# Patient Record
Sex: Male | Born: 1950 | Race: White | Hispanic: No | Marital: Married | Smoking: Current every day smoker
Health system: Southern US, Community
[De-identification: ages and names within clinical notes are randomized; demographics above are authoritative.]

## PROBLEM LIST (undated history)

## (undated) DIAGNOSIS — F329 Major depressive disorder, single episode, unspecified: Secondary | ICD-10-CM

## (undated) DIAGNOSIS — K219 Gastro-esophageal reflux disease without esophagitis: Secondary | ICD-10-CM

## (undated) DIAGNOSIS — M1990 Unspecified osteoarthritis, unspecified site: Secondary | ICD-10-CM

## (undated) DIAGNOSIS — F32A Depression, unspecified: Secondary | ICD-10-CM

## (undated) HISTORY — PX: LUMBAR FUSION: SHX111

## (undated) HISTORY — PX: KNEE SURGERY: SHX244

## (undated) HISTORY — DX: Depression, unspecified: F32.A

## (undated) HISTORY — DX: Gastro-esophageal reflux disease without esophagitis: K21.9

## (undated) HISTORY — DX: Major depressive disorder, single episode, unspecified: F32.9

## (undated) HISTORY — DX: Unspecified osteoarthritis, unspecified site: M19.90

---

## 1981-08-25 HISTORY — PX: LUMBAR LAMINECTOMY: SHX95

## 1984-08-25 HISTORY — PX: ANTERIOR LUMBAR FUSION: SHX1170

## 1987-08-26 HISTORY — PX: INGUINAL HERNIA REPAIR: SUR1180

## 1998-04-18 ENCOUNTER — Emergency Department (HOSPITAL_COMMUNITY): Admission: EM | Admit: 1998-04-18 | Discharge: 1998-04-18 | Payer: Self-pay | Admitting: Emergency Medicine

## 1998-04-18 ENCOUNTER — Encounter: Admission: RE | Admit: 1998-04-18 | Discharge: 1998-04-18 | Payer: Self-pay | Admitting: *Deleted

## 1999-07-09 ENCOUNTER — Emergency Department (HOSPITAL_COMMUNITY): Admission: EM | Admit: 1999-07-09 | Discharge: 1999-07-09 | Payer: Self-pay | Admitting: *Deleted

## 2000-10-02 ENCOUNTER — Ambulatory Visit (HOSPITAL_COMMUNITY): Admission: RE | Admit: 2000-10-02 | Discharge: 2000-10-02 | Payer: Self-pay | Admitting: *Deleted

## 2001-12-17 ENCOUNTER — Encounter: Payer: Self-pay | Admitting: Family Medicine

## 2001-12-17 ENCOUNTER — Encounter: Admission: RE | Admit: 2001-12-17 | Discharge: 2001-12-17 | Payer: Self-pay | Admitting: Family Medicine

## 2003-08-26 HISTORY — PX: BACK SURGERY: SHX140

## 2003-10-10 ENCOUNTER — Emergency Department (HOSPITAL_COMMUNITY): Admission: EM | Admit: 2003-10-10 | Discharge: 2003-10-10 | Payer: Self-pay | Admitting: Emergency Medicine

## 2003-11-01 ENCOUNTER — Ambulatory Visit (HOSPITAL_COMMUNITY): Admission: RE | Admit: 2003-11-01 | Discharge: 2003-11-01 | Payer: Self-pay | Admitting: Orthopedic Surgery

## 2003-11-02 ENCOUNTER — Encounter: Admission: RE | Admit: 2003-11-02 | Discharge: 2003-12-07 | Payer: Self-pay | Admitting: Family Medicine

## 2004-06-25 ENCOUNTER — Emergency Department (HOSPITAL_COMMUNITY): Admission: EM | Admit: 2004-06-25 | Discharge: 2004-06-25 | Payer: Self-pay | Admitting: Emergency Medicine

## 2004-09-02 ENCOUNTER — Encounter: Admission: RE | Admit: 2004-09-02 | Discharge: 2004-09-02 | Payer: Self-pay | Admitting: Family Medicine

## 2004-09-19 ENCOUNTER — Ambulatory Visit (HOSPITAL_COMMUNITY): Admission: RE | Admit: 2004-09-19 | Discharge: 2004-09-19 | Payer: Self-pay | Admitting: *Deleted

## 2004-10-15 ENCOUNTER — Ambulatory Visit (HOSPITAL_COMMUNITY): Admission: RE | Admit: 2004-10-15 | Discharge: 2004-10-15 | Payer: Self-pay | Admitting: Neurosurgery

## 2004-11-08 ENCOUNTER — Ambulatory Visit (HOSPITAL_COMMUNITY): Admission: RE | Admit: 2004-11-08 | Discharge: 2004-11-08 | Payer: Self-pay | Admitting: Neurosurgery

## 2004-11-21 ENCOUNTER — Inpatient Hospital Stay (HOSPITAL_COMMUNITY): Admission: RE | Admit: 2004-11-21 | Discharge: 2004-11-24 | Payer: Self-pay | Admitting: Neurosurgery

## 2005-01-05 ENCOUNTER — Emergency Department: Payer: Self-pay | Admitting: Emergency Medicine

## 2005-05-17 ENCOUNTER — Encounter: Admission: RE | Admit: 2005-05-17 | Discharge: 2005-05-17 | Payer: Self-pay | Admitting: Neurosurgery

## 2010-09-14 ENCOUNTER — Encounter: Payer: Self-pay | Admitting: Family Medicine

## 2011-05-27 ENCOUNTER — Observation Stay: Payer: Self-pay | Admitting: Specialist

## 2011-05-27 ENCOUNTER — Ambulatory Visit: Payer: Self-pay | Admitting: Urology

## 2012-06-08 DIAGNOSIS — N529 Male erectile dysfunction, unspecified: Secondary | ICD-10-CM | POA: Insufficient documentation

## 2013-06-08 DIAGNOSIS — J982 Interstitial emphysema: Secondary | ICD-10-CM | POA: Insufficient documentation

## 2013-06-08 DIAGNOSIS — S42002A Fracture of unspecified part of left clavicle, initial encounter for closed fracture: Secondary | ICD-10-CM | POA: Insufficient documentation

## 2013-06-08 DIAGNOSIS — S2242XA Multiple fractures of ribs, left side, initial encounter for closed fracture: Secondary | ICD-10-CM | POA: Insufficient documentation

## 2013-06-16 DIAGNOSIS — M7989 Other specified soft tissue disorders: Secondary | ICD-10-CM | POA: Insufficient documentation

## 2013-06-20 ENCOUNTER — Encounter: Payer: Self-pay | Admitting: Surgery

## 2013-06-25 ENCOUNTER — Encounter: Payer: Self-pay | Admitting: Surgery

## 2013-07-08 ENCOUNTER — Encounter: Payer: Self-pay | Admitting: Adult Health

## 2013-07-08 ENCOUNTER — Ambulatory Visit (INDEPENDENT_AMBULATORY_CARE_PROVIDER_SITE_OTHER): Payer: Medicare Other | Admitting: Adult Health

## 2013-07-08 VITALS — BP 128/82 | HR 85 | Temp 98.4°F | Resp 14 | Ht 73.0 in | Wt 238.0 lb

## 2013-07-08 DIAGNOSIS — M549 Dorsalgia, unspecified: Secondary | ICD-10-CM

## 2013-07-08 DIAGNOSIS — K219 Gastro-esophageal reflux disease without esophagitis: Secondary | ICD-10-CM | POA: Insufficient documentation

## 2013-07-08 DIAGNOSIS — G8929 Other chronic pain: Secondary | ICD-10-CM | POA: Insufficient documentation
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
## 2013-07-08 NOTE — Progress Notes (Signed)
Pre visit review using our clinic review tool, if applicable. No additional management support is needed unless otherwise documented below in the visit note. 
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
## 2013-07-08 NOTE — Assessment & Plan Note (Signed)
Occasional symptoms. [employer] as needed. Continue to follow.

## 2013-07-08 NOTE — Patient Instructions (Signed)
   Thank you for choosing [place] [facility] for your health care needs.  I am referring you to the [facility] located in [place].  They will call you with an appointment.  I will request your medical records from Dr. Tenny Craw and recent hospitalization at [facility] to see what labs and tests were done.

## 2013-07-25 ENCOUNTER — Encounter: Payer: Self-pay | Admitting: Surgery

## 2013-08-28 ENCOUNTER — Encounter: Payer: Self-pay | Admitting: Adult Health

## 2013-09-06 ENCOUNTER — Telehealth: Payer: Self-pay | Admitting: Adult Health

## 2013-09-06 NOTE — Telephone Encounter (Signed)
The patient is needing a referral for his surgery at [facility] for a broken collar bone Dr. [place] at [place] will be performing the surgery.  The patient's surgery is schedule for 1.16.15. He wants to speak to Raquel.

## 2013-09-07 DIAGNOSIS — Z79891 Long term (current) use of opiate analgesic: Secondary | ICD-10-CM | POA: Insufficient documentation

## 2013-09-07 NOTE — Telephone Encounter (Signed)
Pt states he obtained referral from another office.

## 2013-09-07 NOTE — Telephone Encounter (Signed)
See the note below

## 2013-09-07 NOTE — Telephone Encounter (Signed)
We cannot accept referral for other office through the patient. The office has to contact us directly,and in the future we have to have seen the patient for the referral reason or I cannot process it.

## 2013-09-07 NOTE — Telephone Encounter (Signed)
Amber, Can you call the pt and let him know that his surgeon's office needs to call for referral. Sorry if you have gotten this message more than once. I do not know where the other one went. Raquel

## 2013-12-21 DIAGNOSIS — J449 Chronic obstructive pulmonary disease, unspecified: Secondary | ICD-10-CM | POA: Insufficient documentation

## 2014-01-19 DIAGNOSIS — F4010 Social phobia, unspecified: Secondary | ICD-10-CM | POA: Insufficient documentation

## 2014-02-08 DIAGNOSIS — M961 Postlaminectomy syndrome, not elsewhere classified: Secondary | ICD-10-CM | POA: Insufficient documentation

## 2015-05-14 DIAGNOSIS — N411 Chronic prostatitis: Secondary | ICD-10-CM | POA: Insufficient documentation

## 2015-05-14 DIAGNOSIS — R319 Hematuria, unspecified: Secondary | ICD-10-CM | POA: Insufficient documentation

## 2016-04-09 DIAGNOSIS — Z79899 Other long term (current) drug therapy: Secondary | ICD-10-CM | POA: Insufficient documentation

## 2017-01-28 DIAGNOSIS — Z0289 Encounter for other administrative examinations: Secondary | ICD-10-CM | POA: Insufficient documentation

## 2017-07-03 ENCOUNTER — Telehealth: Payer: Self-pay | Admitting: Urology

## 2017-07-03 NOTE — Telephone Encounter (Signed)
Can we get my last office note from [facility]?  There are no [facility] notes in Care Everywhere.

## 2017-07-03 NOTE — Telephone Encounter (Signed)
Former [employer] Patient called the office today requesting a refill of Sildenafil to be sent to the [facility].  Patient can be reached at [phone].

## 2017-07-07 ENCOUNTER — Encounter: Payer: Self-pay | Admitting: Urology

## 2017-07-07 ENCOUNTER — Telehealth: Payer: Self-pay | Admitting: Urology

## 2017-07-07 ENCOUNTER — Ambulatory Visit (INDEPENDENT_AMBULATORY_CARE_PROVIDER_SITE_OTHER): Payer: BLUE CROSS/BLUE SHIELD | Admitting: Urology

## 2017-07-07 VITALS — BP 149/74 | HR 99 | Ht 73.0 in | Wt 239.4 lb

## 2017-07-07 DIAGNOSIS — N529 Male erectile dysfunction, unspecified: Secondary | ICD-10-CM

## 2017-07-07 MED ORDER — SILDENAFIL CITRATE 20 MG PO TABS: ORAL_TABLET | ORAL | 1 refills | Status: DC

## 2017-07-07 NOTE — Progress Notes (Signed)
07/07/2017 1:48 PM   Vincent Hester 11-11-1950 [id number]  Referring provider: No referring provider defined for this encounter.  Chief Complaint  Patient presents with  . Medication Refill    HPI: 66 year old male presents for medication refill.  I initially saw him at [facility] in February 2017 for BPH, chronic prostatitis and erectile dysfunction.  He had a cystoscopy showing prostate enlargement with inflammatory changes on hypervascularity.  He was having lower urinary tract symptoms however they subsequently resolved.  He presently has no bothersome voiding symptoms.  He denies dysuria or gross hematuria.  There is no flank, abdominal, pelvic or scrotal pain.  He does have erectile dysfunction and is using generic sildenafil 80 mg prn with good results.   PMH: Past Medical History:  Diagnosis Date  . Arthritis   . Depression    Improved  . GERD (gastroesophageal reflux disease)     Surgical History: Past Surgical History:  Procedure Laterality Date  . ANTERIOR LUMBAR FUSION  1986  . BACK SURGERY  2005   Lumbar rod  . INGUINAL HERNIA REPAIR Right 1989  . KNEE SURGERY Left 1996, 2004  . [place] x2   L2-3 and 3-4  . LUMBAR LAMINECTOMY  1983    Home Medications:  Allergies as of 07/07/2017      Reactions   Other Anaphylaxis   Contrast Dye      Medication List        Accurate as of 07/07/17  1:48 PM. Always use your most recent med list.          ADVAIR DISKUS 250-50 MCG/DOSE Aepb Generic drug:  Fluticasone-Salmeterol INHALE 1 PUFF INTO THE LUNGS TWICE A DAY. RINSE MOUTH AFTER EACH USE   HYDROmorphone 4 MG tablet Commonly known as:  DILAUDID Take by mouth every 4 (four) hours as needed for severe pain. Takes 8 mg qam and 8 mg qhs   PROAIR HFA 108 (90 Base) MCG/ACT inhaler Generic drug:  albuterol INHALE 2 PUFFS BY MOUTH EVERY 4-6 HOURS AS NEEDED   sildenafil 20 MG tablet Commonly known as:  REVATIO 2-5 tablets daily as needed   TUMS  500 MG chewable tablet Generic drug:  calcium carbonate Chew by mouth.       Allergies:  Allergies  Allergen Reactions  . Other Anaphylaxis    Contrast Dye    Family History: Family History  Problem Relation Age of Onset  . Arthritis Mother   . Cancer Mother        Cancer - Abdominal  . Arthritis Father   . Stroke Father        Cerebral Hemorrhage - s/p fall  . Dementia Father   . Heart disease Brother   . Early death Brother        Murdered  . Prostate cancer Neg Hx   . Bladder Cancer Neg Hx   . Kidney cancer Neg Hx     Social History:  reports that he quit smoking about 4 years ago. He has a 75.00 pack-year smoking history. he has never used smokeless tobacco. He reports that he drinks alcohol. He reports that he does not use drugs.  ROS: UROLOGY Frequent Urination?: No Hard to postpone urination?: No Burning/pain with urination?: No Get up at night to urinate?: Yes Leakage of urine?: No Urine stream starts and stops?: No Trouble starting stream?: No Do you have to strain to urinate?: No Blood in urine?: No Urinary tract infection?: No  Sexually transmitted disease?: No Injury to kidneys or bladder?: No Painful intercourse?: No Weak stream?: No Erection problems?: Yes Penile pain?: No  Gastrointestinal Nausea?: No Vomiting?: No Indigestion/heartburn?: No Diarrhea?: No Constipation?: No  Constitutional Fever: No Night sweats?: No Weight loss?: No Fatigue?: No  Skin Skin rash/lesions?: No Itching?: No  Eyes Blurred vision?: No Double vision?: No  Ears/Nose/Throat Sore throat?: No Sinus problems?: No  Hematologic/Lymphatic Swollen glands?: No Easy bruising?: No  Cardiovascular Leg swelling?: No Chest pain?: No  Respiratory Cough?: Yes Shortness of breath?: Yes  Endocrine Excessive thirst?: No  Musculoskeletal Back pain?: Yes Joint pain?: Yes  Neurological Headaches?: No Dizziness?: No  Psychologic Depression?:  No Anxiety?: No  Physical Exam: BP (!) 149/74 (BP Location: Right Arm, Patient Position: Sitting, Cuff Size: Normal)   Pulse 99   Ht 6\' 1"  (1.854 m)   Wt 239 lb 6.4 oz (108.6 kg)   BMI 31.59 kg/m   Constitutional:  Alert and oriented, No acute distress. HEENT: [place] AT, moist mucus membranes.  Trachea midline, no masses. Cardiovascular: No clubbing, cyanosis, or edema. Respiratory: Normal respiratory effort, no increased work of breathing. GI: Abdomen is soft, nontender, nondistended, no abdominal masses GU: No CVA tenderness. Skin: No rashes, bruises or suspicious lesions. Lymph: No cervical or inguinal adenopathy. Neurologic: Grossly intact, no focal deficits, moving all 4 extremities. Psychiatric: Normal mood and affect.   Urinalysis Dipstick/microscopy negative   Assessment & Plan:   1.  Erectile dysfunction Doing well on generic sildenafil which was refilled.  Follow-up annually or as needed for any significant change.  His last PSA was in February 2018.  He states he is able to get health screening blood work drawn at his wife's place of employment and stated that he would do so at that time.    Abbie Sons, [place] [address], [place], [place] [address] [phone]

## 2017-07-07 NOTE — Telephone Encounter (Signed)
Patient will need to sign a release form and it takes a month to get records  Can you double check to make sure we can't see them   Sharyn Lull

## 2017-07-08 NOTE — Telephone Encounter (Signed)
Have tried looking in care everywhere since you have seen pt?

## 2018-04-22 ENCOUNTER — Encounter: Payer: Self-pay | Admitting: Surgery

## 2018-04-22 ENCOUNTER — Ambulatory Visit (INDEPENDENT_AMBULATORY_CARE_PROVIDER_SITE_OTHER): Payer: BLUE CROSS/BLUE SHIELD | Admitting: Surgery

## 2018-04-22 VITALS — BP 157/85 | HR 85 | Resp 14 | Ht 75.0 in | Wt 241.0 lb

## 2018-04-22 DIAGNOSIS — K219 Gastro-esophageal reflux disease without esophagitis: Secondary | ICD-10-CM | POA: Diagnosis not present

## 2018-04-22 DIAGNOSIS — K429 Umbilical hernia without obstruction or gangrene: Secondary | ICD-10-CM | POA: Diagnosis not present

## 2018-04-22 NOTE — Progress Notes (Signed)
Surgical Clinic History and Physical  HPI:  67 y.o. Male presents to clinic for concern regarding Right flank vs Left flank asymmetry, stating he is concerned his Right flank appears larger. He previously had a Right inguinal hernia repaired with mesh, and he denies any groin pain or swelling. He also denies any pain associated with a small easily reducible umbilical hernia that he says he never noticed until it was identified during today's exam. He adds that he experiences epigastric > RUQ abdominal pain after acidic foods (specifically tomato sauce), spicy foods, greasy foods, ice cream, and butter. He has been prescribed Prilosec and has a full bottle at home, but "never" takes it, despite describing that his post-prandial abdominal pain is usually relieved by Rolaids. He otherwise reports +flatus and +BM's WNL without fever/chills, N/V, CP, or SOB.  Review of Systems:  Constitutional: denies any other weight loss, fever, chills, or sweats  Eyes: denies any other vision changes, history of eye injury  ENT: denies sore throat, hearing problems  Respiratory: denies shortness of breath, wheezing  Cardiovascular: denies chest pain, palpitations  Gastrointestinal: abdominal pain, N/V, and bowel function as per HPI Musculoskeletal: denies any other joint pains or cramps  Skin: Denies any other rashes or skin discolorations  Neurological: denies any other headache, dizziness, weakness  Psychiatric: denies any other depression, anxiety  All other review of systems: otherwise negative   Past Medical History:  Diagnosis Date  . Arthritis   . Depression    Improved  . GERD (gastroesophageal reflux disease)    Past Surgical History:  Procedure Laterality Date  . ANTERIOR LUMBAR FUSION  1986  . BACK SURGERY  2005   Lumbar rod  . INGUINAL HERNIA REPAIR Right 1989  . KNEE SURGERY Left 1996, 2004  . [place] x2   L2-3 and 3-4  . LUMBAR LAMINECTOMY  1983   Current  Outpatient Medications on File Prior to Visit  Medication Sig Dispense Refill  . albuterol (PROAIR HFA) 108 (90 Base) MCG/ACT inhaler INHALE 2 PUFFS BY MOUTH EVERY 4-6 HOURS AS NEEDED    . calcium carbonate (TUMS) 500 MG chewable tablet Chew by mouth.    . Fluticasone-Salmeterol (ADVAIR DISKUS) 250-50 MCG/DOSE AEPB INHALE 1 PUFF INTO THE LUNGS TWICE A DAY. RINSE MOUTH AFTER EACH USE    . HYDROmorphone (DILAUDID) 4 MG tablet Take by mouth every 4 (four) hours as needed for severe pain. Takes 8 mg qam and 8 mg qhs    . sildenafil (REVATIO) 20 MG tablet 2-5 tablets daily as needed 180 tablet 1   No current facility-administered medications on file prior to visit.    Social History   Socioeconomic History  . Marital status: Married    Spouse name: Angela Nevin  . Number of children: 4  . Years of education: 31  . Highest education level: Not on file  Occupational History  . Occupation: Risk analyst    Comment: Disabled  Social Needs  . Financial resource strain: Not on file  . Food insecurity:    Worry: Not on file    Inability: Not on file  . Transportation needs:    Medical: Not on file    Non-medical: Not on file  Tobacco Use  . Smoking status: Current Every Day Smoker    Packs/day: 0.75    Years: 30.00    Pack years: 22.50    Last attempt to quit: 06/24/2013    Years since quitting: 4.8  . Smokeless tobacco: Never  Used  Substance and Sexual Activity  . Alcohol use: Yes    Comment: Occasional   . Drug use: No  . Sexual activity: Not on file  Lifestyle  . Physical activity:    Days per week: Not on file    Minutes per session: Not on file  . Stress: Not on file  Relationships  . Social connections:    Talks on phone: Not on file    Gets together: Not on file    Attends religious service: Not on file    Active member of club or organization: Not on file    Attends meetings of clubs or organizations: Not on file    Relationship status: Not on file  . Intimate partner violence:     Fear of current or ex partner: Not on file    Emotionally abused: Not on file    Physically abused: Not on file    Forced sexual activity: Not on file  Other Topics Concern  . Not on file  Social History Narrative   Mr. Vincent Hester grew up in [place], [place]. He currently lives at home with his wife, Angela Nevin, of 25 years. He was married previously for 17 years and had 4 children with his first wife (2 sons, 2 daughters). 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 He enjoys riding his motorcycle and spending time with his friends. He also works on cars and restores them. Mr. Feeny also enjoys playing golf.    Vital Signs:  BP (!) 157/85   Pulse 85   Resp 14   Ht 6\' 3"  (1.905 m)   Wt 241 lb (109.3 kg)   BMI 30.12 kg/m    Physical Exam:  Constitutional:  -- Normal body habitus  -- Awake, alert, and oriented x3  Eyes:  -- Pupils equally round and reactive to light  -- No scleral icterus  Ear, nose, throat:  -- No jugular venous distension  -- No nasal drainage, bleeding Pulmonary:  -- No crackles -- Equal breath sounds bilaterally -- Breathing non-labored at rest Cardiovascular:  -- S1, S2 present  -- No pericardial rubs  Gastrointestinal:  -- Soft, nontender, non-distended, no guarding/rebound  -- Easily reducible completely non-tender umbilical hernia -- Subtle Right flank > Left flank asymmetry without hernia or any detectable defect -- No other abdominal masses appreciated, pulsatile or otherwise  Musculoskeletal / Integumentary:  -- Wounds or skin discoloration: None appreciated  -- Extremities: B/L UE and LE FROM, hands and feet warm, no edema  Neurologic:  -- Motor function: intact and symmetric  -- Sensation: intact and symmetric   Laboratory studies: CBC: No results found for: WBC, RBC BMP: No results found for: GLUCOSE, POTASSIUM, CHLORIDE, CO2, BUN, CREATININE, CALCIUM   Imaging: No new pertinent imaging studies available  for review   Assessment:  67 y.o. yo Male presents to clinic for flank asymmetry (R > L), but complains also of symptoms attributable to GERD and is found to have an asymptomatic umbilical hernia, complicated otherwise by comorbidities including osteoarthritis, chronic narcotics for chronic lower back pain, chronic ongoing tobacco abuse (smoking), and history of major depression disorder.  Plan:   - take Prilosec once daily x 30 days (routine, not prn)  - if RUQ abdominal pain persists despite PPI, consider RUQ u/s  - keep log of foods after which pain and how modifying diet affects pain  - all risks, benefits, and alternatives to repair of umbilical  hernia with mesh were discussed with the patient, and all of his questions were answered to his expressed satisfaction, patient expresses his hernia does not at this time bother him, and repair is accordingly deferred at this time.  - return to clinic 1 month, instructed to call office if any questions or concerns  - smoking cessation discussed and strongly encouraged  All of the above recommendations were discussed with the patient, and all of patient's questions were answered to his expressed satisfaction.  -- Marilynne Drivers Rosana Hoes, MD, [place]: [facility] for exceptional care. Office: [phone]

## 2018-04-22 NOTE — Patient Instructions (Addendum)
Give Prilosec prescription a try and follow up with Dr Rosana Hoes in one month.  Appointment 05/20/18 at 11:00 am with Dr Rosana Hoes.

## 2018-04-28 ENCOUNTER — Encounter: Payer: Self-pay | Admitting: Surgery

## 2018-05-20 ENCOUNTER — Ambulatory Visit: Payer: Self-pay | Admitting: Surgery

## 2018-07-06 ENCOUNTER — Ambulatory Visit: Payer: Medicare HMO | Admitting: Urology

## 2018-07-21 ENCOUNTER — Encounter: Payer: Self-pay | Admitting: *Deleted

## 2018-11-29 ENCOUNTER — Telehealth: Payer: Self-pay | Admitting: Urology

## 2018-11-29 NOTE — Telephone Encounter (Signed)
Pt needs refill for Sildenafil.Please advise.

## 2018-11-29 NOTE — Telephone Encounter (Signed)
App made and patient will be available for virtual call   Sharyn Lull

## 2018-11-29 NOTE — Telephone Encounter (Signed)
Will need a telephone or virtual visit.

## 2018-12-02 ENCOUNTER — Telehealth (INDEPENDENT_AMBULATORY_CARE_PROVIDER_SITE_OTHER): Payer: Medicare HMO | Admitting: Urology

## 2018-12-02 ENCOUNTER — Other Ambulatory Visit: Payer: Self-pay

## 2018-12-02 DIAGNOSIS — N529 Male erectile dysfunction, unspecified: Secondary | ICD-10-CM

## 2018-12-02 DIAGNOSIS — N401 Enlarged prostate with lower urinary tract symptoms: Secondary | ICD-10-CM | POA: Diagnosis not present

## 2018-12-02 MED ORDER — SILDENAFIL CITRATE 20 MG PO TABS: ORAL_TABLET | ORAL | 1 refills | Status: DC

## 2018-12-02 NOTE — Progress Notes (Signed)
Virtual Visit via Telephone Note  I connected with Vincent Hester on 12/02/18 at 10:30 AM EDT by telephone and verified that I am speaking with the correct person using two identifiers.   I discussed the limitations, risks, security and privacy concerns of performing an evaluation and management service by telephone and the availability of in person appointments. We discussed the impact of the COVID-19 on the healthcare system, and the importance of social distancing and reducing patient and provider exposure. I also discussed with the patient that there may be a patient responsible charge related to this service. The patient expressed understanding and agreed to proceed.  Reason for visit: Telephone follow-up secondary to COVID-19  History of Present Illness: Vincent Hester is a 68 year old male followed for BPH, erectile dysfunction and history of chronic prostatitis.  He was last seen in November 2018 and recently called for a sildenafil refill.  He is using 80 mg with good results.  Since his last visit he denies diagnosis of coronary artery disease or use of oral/sublingual nitrates.  He is tolerating the sildenafil without problems.  Assessment and Plan: Erectile dysfunction doing well on sildenafil which was refilled.  Follow Up: Will schedule an in office visit in approximately 4 months.   I discussed the assessment and treatment plan with the patient. The patient was provided an opportunity to ask questions and all were answered. The patient agreed with the plan and demonstrated an understanding of the instructions.   The patient was advised to call back or seek an in-person evaluation if the symptoms worsen or if the condition fails to improve as anticipated.  I provided 5 minutes of non-face-to-face time during this encounter.   Vincent Sons, MD

## 2019-04-06 ENCOUNTER — Ambulatory Visit: Payer: Medicare HMO | Admitting: Urology

## 2019-04-19 ENCOUNTER — Encounter: Payer: Self-pay | Admitting: Urology

## 2019-04-19 ENCOUNTER — Other Ambulatory Visit: Payer: Self-pay

## 2019-04-19 ENCOUNTER — Ambulatory Visit (INDEPENDENT_AMBULATORY_CARE_PROVIDER_SITE_OTHER): Payer: Medicare HMO | Admitting: Urology

## 2019-04-19 VITALS — BP 150/82 | HR 105 | Ht 75.0 in | Wt 245.0 lb

## 2019-04-19 DIAGNOSIS — N401 Enlarged prostate with lower urinary tract symptoms: Secondary | ICD-10-CM | POA: Diagnosis not present

## 2019-04-19 DIAGNOSIS — Z87438 Personal history of other diseases of male genital organs: Secondary | ICD-10-CM | POA: Diagnosis not present

## 2019-04-19 DIAGNOSIS — N5201 Erectile dysfunction due to arterial insufficiency: Secondary | ICD-10-CM | POA: Diagnosis not present

## 2019-04-19 MED ORDER — SILDENAFIL CITRATE 20 MG PO TABS: ORAL_TABLET | ORAL | 1 refills | Status: DC

## 2019-04-19 NOTE — Progress Notes (Signed)
04/19/2019 1:03 PM   Vincent Hester Jan 17, 1951 [id number]  Referring provider: No referring provider defined for this encounter.  Chief Complaint  Patient presents with  . Erectile Dysfunction    Urologic history: 1.  BPH with lower urinary tract symptoms  -Mild voiding symptoms  -Cystoscopy BPH, hypervascularity, inflammatory change  2.  History chronic prostatitis  3.  Erectile dysfunction  -Sildenafil 80 mg   HPI: 69 y.o. male presents for follow-up.  He had a telehealth visit in April 2020.  He states he has been doing well.  Denies bothersome lower urinary tract symptoms.  No recent prostatitis flares and he has identified more than 2 cups of coffee as a trigger.  Sildenafil continues to be effective at an 80 mg dose.  He is late for his physical this year and states he will get a PSA drawn at that time.  His PSA May 2019 was 0.6.   PMH: Past Medical History:  Diagnosis Date  . Arthritis   . Depression    Improved  . GERD (gastroesophageal reflux disease)     Surgical History: Past Surgical History:  Procedure Laterality Date  . ANTERIOR LUMBAR FUSION  1986  . BACK SURGERY  2005   Lumbar rod  . INGUINAL HERNIA REPAIR Right 1989  . KNEE SURGERY Left 1996, 2004  . [place] x2   L2-3 and 3-4  . LUMBAR LAMINECTOMY  1983    Home Medications:  Allergies as of 04/19/2019      Reactions   Iodinated Diagnostic Agents Anaphylaxis, Shortness Of Breath   Other Anaphylaxis   Contrast Dye      Medication List       Accurate as of April 19, 2019  1:03 PM. If you have any questions, ask your nurse or doctor.        Advair Diskus 250-50 MCG/DOSE Aepb Generic drug: Fluticasone-Salmeterol INHALE 1 PUFF INTO THE LUNGS TWICE A DAY. RINSE MOUTH AFTER EACH USE   HYDROmorphone 4 MG tablet Commonly known as: DILAUDID Take by mouth every 4 (four) hours as needed for severe pain. Takes 8 mg qam and 8 mg qhs   ProAir HFA 108 (90 Base) MCG/ACT  inhaler Generic drug: albuterol INHALE 2 PUFFS BY MOUTH EVERY 4-6 HOURS AS NEEDED   sildenafil 20 MG tablet Commonly known as: REVATIO 2-5 tablets daily as needed   Tums 500 MG chewable tablet Generic drug: calcium carbonate Chew by mouth.       Allergies:  Allergies  Allergen Reactions  . Iodinated Diagnostic Agents Anaphylaxis and Shortness Of Breath  . Other Anaphylaxis    Contrast Dye    Family History: Family History  Problem Relation Age of Onset  . Arthritis Mother   . Cancer Mother        Cancer - Abdominal  . Arthritis Father   . Stroke Father        Cerebral Hemorrhage - s/p fall  . Dementia Father   . Heart disease Brother   . Early death Brother        Murdered  . Prostate cancer Neg Hx   . Bladder Cancer Neg Hx   . Kidney cancer Neg Hx     Social History:  reports that he has been smoking. He has a 22.50 pack-year smoking history. He has never used smokeless tobacco. He reports current alcohol use. He reports that he does not use drugs.  ROS: UROLOGY Frequent Urination?: Yes Hard to postpone  urination?: No Burning/pain with urination?: No Get up at night to urinate?: Yes Leakage of urine?: Yes Urine stream starts and stops?: Yes Trouble starting stream?: No Do you have to strain to urinate?: No Blood in urine?: No Urinary tract infection?: No Sexually transmitted disease?: No Injury to kidneys or bladder?: No Painful intercourse?: No Weak stream?: No Erection problems?: No Penile pain?: No  Gastrointestinal Nausea?: No Vomiting?: No Indigestion/heartburn?: No Diarrhea?: No Constipation?: No  Constitutional Fever: No Night sweats?: No Weight loss?: No Fatigue?: Yes  Skin Skin rash/lesions?: No Itching?: No  Eyes Blurred vision?: No Double vision?: No  Ears/Nose/Throat Sore throat?: No Sinus problems?: No  Hematologic/Lymphatic Swollen glands?: No Easy bruising?: Yes  Cardiovascular Leg swelling?: No Chest pain?:  No  Respiratory Cough?: No Shortness of breath?: Yes  Endocrine Excessive thirst?: No  Musculoskeletal Back pain?: No Joint pain?: No  Neurological Headaches?: No Dizziness?: No  Psychologic Depression?: No Anxiety?: Yes  Physical Exam: BP (!) 150/82   Pulse (!) 105   Ht 6\' 3"  (1.905 m)   Wt 245 lb (111.1 kg)   BMI 30.62 kg/m   Constitutional:  Alert and oriented, No acute distress. HEENT: [place] AT, moist mucus membranes.  Trachea midline, no masses. Cardiovascular: No clubbing, cyanosis, or edema. Respiratory: Normal respiratory effort, no increased work of breathing. GI: Abdomen is soft, nontender, nondistended, no abdominal masses GU: No CVA tenderness Lymph: No cervical or inguinal lymphadenopathy. Skin: No rashes, bruises or suspicious lesions. Neurologic: Grossly intact, no focal deficits, moving all 4 extremities. Psychiatric: Normal mood and affect.   Assessment & Plan:   Vincent Hester is doing well.  His voiding symptoms are not bothersome.  Sildenafil continues to be effective for his ED which was refilled.  We discussed current recommendations for prostate cancer screening and that screening can be discontinued at age 63-70.  We also discussed that healthy patients can be pushed out age 52.  He desires to continue PSA screening and states he will have drawn with his PCP at his annual physical.  Continue annual follow-up.  Abbie Sons, [facility] [address] [place], [place] [address] [phone]

## 2019-04-26 ENCOUNTER — Ambulatory Visit
Admission: RE | Admit: 2019-04-26 | Discharge: 2019-04-26 | Disposition: A | Discharge status: Home / Self Care | Payer: Medicare HMO | Source: Ambulatory Visit | Attending: Physician Assistant | Admitting: Physician Assistant

## 2019-04-26 ENCOUNTER — Encounter: Payer: Self-pay | Admitting: Physician Assistant

## 2019-04-26 ENCOUNTER — Other Ambulatory Visit: Payer: Self-pay

## 2019-04-26 ENCOUNTER — Ambulatory Visit (INDEPENDENT_AMBULATORY_CARE_PROVIDER_SITE_OTHER): Payer: Medicare HMO | Admitting: Physician Assistant

## 2019-04-26 VITALS — BP 175/96 | HR 103 | Ht 75.0 in | Wt 245.0 lb

## 2019-04-26 DIAGNOSIS — N390 Urinary tract infection, site not specified: Secondary | ICD-10-CM | POA: Diagnosis not present

## 2019-04-26 DIAGNOSIS — R109 Unspecified abdominal pain: Secondary | ICD-10-CM

## 2019-04-26 DIAGNOSIS — N401 Enlarged prostate with lower urinary tract symptoms: Secondary | ICD-10-CM

## 2019-04-26 DIAGNOSIS — R3129 Other microscopic hematuria: Secondary | ICD-10-CM

## 2019-04-26 DIAGNOSIS — R300 Dysuria: Secondary | ICD-10-CM | POA: Diagnosis not present

## 2019-04-26 LAB — MICROSCOPIC EXAMINATION
Bacteria, UA: NONE SEEN
Epithelial Cells (non renal): NONE SEEN /hpf (ref 0–10)
WBC, UA: NONE SEEN /hpf (ref 0–5)

## 2019-04-26 LAB — URINALYSIS, COMPLETE
Bilirubin, UA: NEGATIVE
Glucose, UA: NEGATIVE
Ketones, UA: NEGATIVE
Leukocytes,UA: NEGATIVE
Nitrite, UA: NEGATIVE
Protein,UA: NEGATIVE
Specific Gravity, UA: >1.03 — ABNORMAL HIGH (ref 1.005–1.030)
Urobilinogen, Ur: 0.2 mg/dL (ref 0.2–1.0)
pH, UA: 5 (ref 5.0–7.5)

## 2019-04-26 LAB — BLADDER SCAN AMB NON-IMAGING: Scan Result: 0

## 2019-04-26 MED ORDER — TAMSULOSIN HCL 0.4 MG PO CAPS: 0.4000 mg | ORAL_CAPSULE | Freq: Every day | ORAL | 0 refills | Status: AC

## 2019-04-26 NOTE — Progress Notes (Signed)
04/26/2019 7:58 AM   Vincent Hester [birth year] [id number]  CC: Pelvic pain, dark and malodorous urine  HPI: Vincent Hester is a 68 y.o. male who presents today for evaluation of possible UTI. He is an established [facility] patient who last saw Vincent Hester for routine follow-up of BPH with LUTS, chronic prostatitis, and ED. Last cystoscopy 2017 with hypervascularity and inflammatory changes.  He reports a 3-day history of pelvic pain worse with urination and dark, malodorous urine. He denies urgency, frequency, gross hematuria, nausea, vomiting, fevers, and chills. He has taken nothing at home for symptom palliation. He does note some left flank pain at baseline that has been different in character for the past several weeks.  He does not have a history of recurrent UTI. He does not have a history of urolithiasis. He does report a history of microscopic hematuria.  In-office UA today positive for 2+ blood; urine microscopy with 3-10 RBCs/HPF. PVR 54mL.  PMH: Past Medical History:  Diagnosis Date  . Arthritis   . Depression    Improved  . GERD (gastroesophageal reflux disease)     Surgical History: Past Surgical History:  Procedure Laterality Date  . ANTERIOR LUMBAR FUSION  1986  . BACK SURGERY  2005   Lumbar rod  . INGUINAL HERNIA REPAIR Right 1989  . KNEE SURGERY Left 1996, 2004  . [place] x2   L2-3 and 3-4  . LUMBAR LAMINECTOMY  1983    Home Medications:  Allergies as of 04/26/2019      Reactions   Iodinated Diagnostic Agents Anaphylaxis, Shortness Of Breath   Other Anaphylaxis   Contrast Dye      Medication List       Accurate as of April 26, 2019 11:59 PM. If you have any questions, ask your nurse or doctor.        Advair Diskus 250-50 MCG/DOSE Aepb Generic drug: Fluticasone-Salmeterol INHALE 1 PUFF INTO THE LUNGS TWICE A DAY. RINSE MOUTH AFTER EACH USE   HYDROmorphone 4 MG tablet Commonly known as: DILAUDID Take by mouth every 4  (four) hours as needed for severe pain. Takes 8 mg qam and 8 mg qhs   ProAir HFA 108 (90 Base) MCG/ACT inhaler Generic drug: albuterol INHALE 2 PUFFS BY MOUTH EVERY 4-6 HOURS AS NEEDED   sildenafil 20 MG tablet Commonly known as: REVATIO 2-5 tablets daily as needed   tamsulosin 0.4 MG Caps capsule Commonly known as: FLOMAX Take 1 capsule (0.4 mg total) by mouth daily for 14 days. Started by: Vincent Loop, PA-C   Tums 500 MG chewable tablet Generic drug: calcium carbonate Chew by mouth.       Allergies:  Allergies  Allergen Reactions  . Iodinated Diagnostic Agents Anaphylaxis and Shortness Of Breath  . Other Anaphylaxis    Contrast Dye    Family History: Family History  Problem Relation Age of Onset  . Arthritis Mother   . Cancer Mother        Cancer - Abdominal  . Arthritis Father   . Stroke Father        Cerebral Hemorrhage - s/p fall  . Dementia Father   . Heart disease Brother   . Early death Brother        Murdered  . Prostate cancer Neg Hx   . Bladder Cancer Neg Hx   . Kidney cancer Neg Hx     Social History:   reports that he has been smoking. He  has a 22.50 pack-year smoking history. He has never used smokeless tobacco. He reports current alcohol use. He reports that he does not use drugs.  ROS: UROLOGY Frequent Urination?: No Hard to postpone urination?: No Burning/pain with urination?: Yes Get up at night to urinate?: Yes Leakage of urine?: Yes Urine stream starts and stops?: Yes Trouble starting stream?: No Do you have to strain to urinate?: No Blood in urine?: No Urinary tract infection?: Yes Sexually transmitted disease?: No Injury to kidneys or bladder?: No Painful intercourse?: No Weak stream?: Yes Erection problems?: No Penile pain?: No  Gastrointestinal Nausea?: No Vomiting?: No Indigestion/heartburn?: No Diarrhea?: No Constipation?: No  Constitutional Fever: No Night sweats?: No Weight loss?: No Fatigue?: Yes   Skin Skin rash/lesions?: No Itching?: No  Eyes Blurred vision?: No Double vision?: No  Ears/Nose/Throat Sore throat?: No Sinus problems?: No  Hematologic/Lymphatic Swollen glands?: No Easy bruising?: No  Cardiovascular Leg swelling?: No Chest pain?: No  Respiratory Cough?: No Shortness of breath?: Yes  Endocrine Excessive thirst?: No  Musculoskeletal Back pain?: Yes Joint pain?: Yes  Neurological Headaches?: No Dizziness?: No  Psychologic Depression?: No Anxiety?: Yes  Physical Exam: BP (!) 175/96   Pulse (!) 103   Ht 6\' 3"  (1.905 m)   Wt 245 lb (111.1 kg)   BMI 30.62 kg/m   Constitutional:  Alert and oriented, no acute distress, nontoxic appearing HEENT: [place], AT Cardiovascular: No clubbing, cyanosis, or edema Respiratory: Normal respiratory effort, no increased work of breathing Skin: No rashes, bruises or suspicious lesions Neurologic: Grossly intact, no focal deficits, moving all 4 extremities Psychiatric: Normal mood and affect  Laboratory Data: Results for orders placed or performed in visit on 04/26/19  Microscopic Examination   URINE  Result Value Ref Range   WBC, UA None seen 0 - 5 /hpf   RBC 3-10 (A) 0 - 2 /hpf   Epithelial Cells (non renal) None seen 0 - 10 /hpf   Mucus, UA Present (A) Not Estab.   Bacteria, UA None seen None seen/Few  Urinalysis, Complete  Result Value Ref Range   Specific Gravity, UA >1.030 (H) 1.005 - 1.030   pH, UA 5.0 5.0 - 7.5   Color, UA Yellow Yellow   Appearance Ur Hazy (A) Clear   Leukocytes,UA Negative Negative   Protein,UA Negative Negative/Trace   Glucose, UA Negative Negative   Ketones, UA Negative Negative   RBC, UA 2+ (A) Negative   Bilirubin, UA Negative Negative   Urobilinogen, Ur 0.2 0.2 - 1.0 mg/dL   Nitrite, UA Negative Negative   Microscopic Examination See below:   Bladder Scan (Post Void Residual) in office  Result Value Ref Range   Scan Result 0    Assessment & Plan:   1. Dysuria  Patient reports 3-day history of irritative voiding symptoms, however UA without inflammatory changes today.  PVR normal.  I am not concerned for urinary retention at this time.  I will send urine for culture, however I do not believe he is infected at this time.  Upon questioning, he does report a recent prodrome of left flank pain that is different in character from the pain he experiences at baseline.  He does not have a history of urolithiasis, however most recent available imaging is from 2012.  I believe he may be passing a stone at this time.  I explained to the patient that distal stones can present in a manner similar to urinary tract infections.  I ordered a KUB today to  evaluate him for possible stones and prescribed 2 weeks of tamsulosin to aid in stone passage.  Patient has previously taken tamsulosin but discontinued it secondary to retrograde ejaculation.  I expect this symptom to recur over the next 2 weeks, however the benefit of this short-term therapy outweighs this unpleasant side effect. - Urinalysis, Complete - Urine culture - Bladder Scan (Post Void Residual) in office - KUB - Tamsulosin 0.4mg  daily x14 days  2. Microscopic hematuria Patient with microscopic hematuria on urine microscopy today.  This may be secondary to a stone or infection.  Will await results of urine culture and KUB before ordering further evaluation.    Patient has had intermittent microscopic hematuria in the past, however most recent cystoscopy is 68 years old.  It may be prudent to repeat at this time.  Vincent Loop, PA-C  [facility] [address] [place], [place] [address] [phone]

## 2019-04-28 LAB — CULTURE, URINE COMPREHENSIVE

## 2019-04-29 ENCOUNTER — Telehealth: Payer: Self-pay | Admitting: Physician Assistant

## 2019-04-29 ENCOUNTER — Other Ambulatory Visit: Payer: Self-pay | Admitting: Physician Assistant

## 2019-04-29 DIAGNOSIS — R109 Unspecified abdominal pain: Secondary | ICD-10-CM

## 2019-04-29 NOTE — Telephone Encounter (Signed)
I contacted the patient via telephone this morning to discuss the results of his urine culture.  I explained that his urine culture is negative, and he does not have UTI at this time.  I explained that I believe his symptoms may still be due to urolithiasis despite a negative KUB.  I would like to proceed with CT stone study at this time.  Order placed.  He expressed financial concerns with the cost of this study.  Spoke with Sharyn Lull in our office regarding obtaining prior authorization and having him obtain imaging at [facility] imaging, which is known to be a more affordable option.  If cost continues to be a concern, may consider renal ultrasound as an alternative.

## 2019-06-03 ENCOUNTER — Other Ambulatory Visit: Payer: Self-pay

## 2019-06-03 ENCOUNTER — Ambulatory Visit (INDEPENDENT_AMBULATORY_CARE_PROVIDER_SITE_OTHER): Payer: Medicare HMO | Admitting: Urology

## 2019-06-03 ENCOUNTER — Encounter: Payer: Self-pay | Admitting: Urology

## 2019-06-03 DIAGNOSIS — R3129 Other microscopic hematuria: Secondary | ICD-10-CM | POA: Diagnosis not present

## 2019-06-03 DIAGNOSIS — N411 Chronic prostatitis: Secondary | ICD-10-CM

## 2019-06-03 DIAGNOSIS — N401 Enlarged prostate with lower urinary tract symptoms: Secondary | ICD-10-CM | POA: Diagnosis not present

## 2019-06-03 DIAGNOSIS — R102 Pelvic and perineal pain: Secondary | ICD-10-CM | POA: Diagnosis not present

## 2019-06-03 LAB — URINALYSIS, COMPLETE
Bilirubin, UA: NEGATIVE
Glucose, UA: NEGATIVE
Ketones, UA: NEGATIVE
Leukocytes,UA: NEGATIVE
Nitrite, UA: NEGATIVE
Protein,UA: NEGATIVE
Specific Gravity, UA: >1.03 — ABNORMAL HIGH (ref 1.005–1.030)
Urobilinogen, Ur: 0.2 mg/dL (ref 0.2–1.0)
pH, UA: 5 (ref 5.0–7.5)

## 2019-06-03 LAB — MICROSCOPIC EXAMINATION
Bacteria, UA: NONE SEEN
Epithelial Cells (non renal): NONE SEEN /hpf (ref 0–10)
WBC, UA: NONE SEEN /hpf (ref 0–5)

## 2019-06-03 NOTE — Progress Notes (Signed)
06/03/2019 2:55 PM   Vincent Hester T [place] 1950/10/22 [id number]  Referring provider: [facility] [address] [place],  [place] [address]  Chief Complaint  Patient presents with  . Groin Pain    lots of pressure    Urologic history: 1.  BPH with lower urinary tract symptoms             -Mild voiding symptoms             -Cystoscopy BPH, hypervascularity, inflammatory change  2.  History chronic prostatitis  3.  Erectile dysfunction             -Sildenafil 80 mg  HPI: 68 y.o. male who I saw in late August for the above problem list and he was doing well without symptoms.  He returned on 04/26/2019 and saw [facility] complaining of increased pelvic pain.  His urinalysis showed 3-10 RBCs.  A urine culture was negative.  A stone protocol CT was recommended however he wanted to hold off due to expense.  He presents today complaining of persistent symptoms.  He also has developed lower urinary tract symptoms including frequency, urgency, intermittent urinary stream and urinary hesitancy.  He complains of dull perineal discomfort which he rates at 2/10 and states he feels like he is "sitting on a baseball".  Denies gross hematuria.  He was started on tamsulosin but it has not significantly improved his symptoms.   PMH: Past Medical History:  Diagnosis Date  . Arthritis   . Depression    Improved  . GERD (gastroesophageal reflux disease)     Surgical History: Past Surgical History:  Procedure Laterality Date  . ANTERIOR LUMBAR FUSION  1986  . BACK SURGERY  2005   Lumbar rod  . INGUINAL HERNIA REPAIR Right 1989  . KNEE SURGERY Left 1996, 2004  . [place] x2   L2-3 and 3-4  . LUMBAR LAMINECTOMY  1983    Home Medications:  Allergies as of 06/03/2019      Reactions   Iodinated Diagnostic Agents Anaphylaxis, Shortness Of Breath   Other Anaphylaxis   Contrast Dye      Medication List       Accurate as of June 03, 2019  2:55 PM. If  you have any questions, ask your nurse or doctor.        Advair Diskus 250-50 MCG/DOSE Aepb Generic drug: Fluticasone-Salmeterol INHALE 1 PUFF INTO THE LUNGS TWICE A DAY. RINSE MOUTH AFTER EACH USE   HYDROmorphone 4 MG tablet Commonly known as: DILAUDID Take by mouth every 4 (four) hours as needed for severe pain. Takes 8 mg qam and 8 mg qhs   ProAir HFA 108 (90 Base) MCG/ACT inhaler Generic drug: albuterol INHALE 2 PUFFS BY MOUTH EVERY 4-6 HOURS AS NEEDED   sildenafil 20 MG tablet Commonly known as: REVATIO 2-5 tablets daily as needed   tamsulosin 0.4 MG Caps capsule Commonly known as: FLOMAX TAKE 1 CAPSULE (0.4 MG TOTAL) BY MOUTH DAILY FOR 14 DAYS.   Tums 500 MG chewable tablet Generic drug: calcium carbonate Chew by mouth.       Allergies:  Allergies  Allergen Reactions  . Iodinated Diagnostic Agents Anaphylaxis and Shortness Of Breath  . Other Anaphylaxis    Contrast Dye    Family History: Family History  Problem Relation Age of Onset  . Arthritis Mother   . Cancer Mother        Cancer - Abdominal  . Arthritis  Father   . Stroke Father        Cerebral Hemorrhage - s/p fall  . Dementia Father   . Heart disease Brother   . Early death Brother        Murdered  . Prostate cancer Neg Hx   . Bladder Cancer Neg Hx   . Kidney cancer Neg Hx     Social History:  reports that he has been smoking. He has a 22.50 pack-year smoking history. He has never used smokeless tobacco. He reports current alcohol use. He reports that he does not use drugs.  ROS: UROLOGY Frequent Urination?: Yes Hard to postpone urination?: Yes Burning/pain with urination?: Yes Get up at night to urinate?: Yes Leakage of urine?: Yes Urine stream starts and stops?: Yes Trouble starting stream?: Yes Do you have to strain to urinate?: Yes Blood in urine?: Yes Urinary tract infection?: No Sexually transmitted disease?: No Injury to kidneys or bladder?: No Painful intercourse?: No Weak  stream?: Yes Erection problems?: No Penile pain?: No  Gastrointestinal Nausea?: No Vomiting?: No Indigestion/heartburn?: Yes Diarrhea?: No Constipation?: No  Constitutional Fever: No Night sweats?: No Weight loss?: No Fatigue?: Yes  Skin Skin rash/lesions?: No Itching?: No  Eyes Blurred vision?: No Double vision?: No  Ears/Nose/Throat Sore throat?: No Sinus problems?: No  Hematologic/Lymphatic Swollen glands?: No Easy bruising?: No  Cardiovascular Leg swelling?: No Chest pain?: No  Respiratory Cough?: No Shortness of breath?: Yes  Endocrine Excessive thirst?: No  Musculoskeletal Back pain?: No Joint pain?: No  Neurological Headaches?: No Dizziness?: No  Psychologic Depression?: No Anxiety?: Yes  Physical Exam: BP 139/72 (BP Location: Left Arm, Patient Position: Sitting, Cuff Size: Normal)   Pulse 97   Ht 6\' 3"  (1.905 m)   Wt 248 lb (112.5 kg)   BMI 31.00 kg/m   Constitutional:  Alert and oriented, No acute distress. HEENT: [place] AT, moist mucus membranes.  Trachea midline, no masses. Cardiovascular: No clubbing, cyanosis, or edema. Respiratory: Normal respiratory effort, no increased work of breathing. Skin: No rashes, bruises or suspicious lesions. Neurologic: Grossly intact, no focal deficits, moving all 4 extremities. Psychiatric: Normal mood and affect.  Laboratory Data:  Urinalysis Dipstick 2+ blood Microscopy 3-10 RBC   Assessment & Plan:   68 y.o. male with lower urinary tract symptoms and pelvic pain.  He has persistent microhematuria.  We discussed potential etiologies and including urinary tract calculi, inflammatory prostatitis and less likely malignant etiologies.  Would recommend a contrasted imaging.  He has a history of anaphylaxis with iodinated contrast and will schedule a MR urogram.  If no significant abnormalities identified on upper tract imaging will proceed with cystoscopy.   Abbie Sons, [place] [address] [place], [place] [address] [phone]

## 2019-06-05 ENCOUNTER — Encounter: Payer: Self-pay | Admitting: Urology

## 2019-06-24 ENCOUNTER — Ambulatory Visit
Admission: RE | Admit: 2019-06-24 | Discharge: 2019-06-24 | Disposition: A | Discharge status: Home / Self Care | Payer: Medicare HMO | Source: Ambulatory Visit | Attending: Urology | Admitting: Urology

## 2019-06-24 ENCOUNTER — Other Ambulatory Visit: Payer: Self-pay

## 2019-06-24 DIAGNOSIS — R3129 Other microscopic hematuria: Secondary | ICD-10-CM

## 2019-06-24 DIAGNOSIS — R102 Pelvic and perineal pain: Secondary | ICD-10-CM | POA: Insufficient documentation

## 2019-06-24 MED ORDER — GADOBUTROL 1 MMOL/ML IV SOLN
10.0000 mL | Freq: Once | INTRAVENOUS | Status: AC | PRN
  Administered 2019-06-24: 15:00:00 10 mL via INTRAVENOUS

## 2019-06-28 ENCOUNTER — Telehealth: Payer: Self-pay | Admitting: Urology

## 2019-06-28 MED ORDER — DOXYCYCLINE HYCLATE 100 MG PO CAPS: 100.0000 mg | ORAL_CAPSULE | Freq: Two times a day (BID) | ORAL | 0 refills | Status: DC

## 2019-06-28 NOTE — Telephone Encounter (Signed)
I contacted Vincent Hester regarding his MRI abdomen/pelvis results.  No renal abnormalities were noted except for small simple cysts.  There was a low-level lesion in the right mid prostate and this was not a dedicated prostate MRI.  The radiologist speculated the concern of prostate cancer however he has history of chronic prostatitis, benign DRE and a stable PSA at 0.64.  This prostate finding most likely represents prostatic inflammation.  His symptoms have slightly improved but are intermittent.  Will go ahead and treat with a course of doxycycline empirically and have him follow-up in 6 weeks for reassessment.

## 2019-06-30 ENCOUNTER — Telehealth: Payer: Self-pay | Admitting: Urology

## 2019-06-30 DIAGNOSIS — N411 Chronic prostatitis: Secondary | ICD-10-CM

## 2019-06-30 MED ORDER — DOXYCYCLINE HYCLATE 100 MG PO CAPS: 100.0000 mg | ORAL_CAPSULE | Freq: Two times a day (BID) | ORAL | 0 refills | Status: AC

## 2019-06-30 NOTE — Telephone Encounter (Signed)
Pt calling asking about his antibiotics that was "supposed" to be called in per Dr. Bernardo Heater, upon researching, the Rx was sent to wrong pharmacy, Pt does not use [facility] at all and would like to have that removed from his chart. Please [facility] [place]. Please advise. Thanks.

## 2019-06-30 NOTE — Telephone Encounter (Signed)
RX cancelled at [facility] and sent to the [facility].

## 2019-08-08 ENCOUNTER — Other Ambulatory Visit: Payer: Self-pay | Admitting: Urology

## 2019-08-08 DIAGNOSIS — N411 Chronic prostatitis: Secondary | ICD-10-CM

## 2019-08-11 ENCOUNTER — Encounter: Payer: Self-pay | Admitting: Urology

## 2019-08-11 ENCOUNTER — Ambulatory Visit: Payer: Medicare HMO | Admitting: Urology

## 2020-04-18 NOTE — Progress Notes (Signed)
04/19/2020 1:54 PM   Vincent Hester 21-Jun-1951 [id number]  Referring provider: [facility] [address] [place],  [place] [address] Chief Complaint  Patient presents with   Benign Prostatic Hypertrophy    Urologic history: 1.BPH with lower urinary tract symptoms -Mild voiding symptoms -Cystoscopy BPH, hypervascularity, inflammatory change  2.History chronic prostatitis  3.Erectile dysfunction -Sildenafil 80 mg  HPI: Vincent Hester is a 69 y.o. male who returns for a 1 year follow up.  -Seen approximately 1 year ago for bothersome pelvic and perineal pain thought secondary to nonbacterial prostatitis -An MRI abdomen pelvis which did show a lesion in the prostate although study not tailored for a prostate-specific MRI -PSA July 2020 was 0.64 -Because of persistent symptoms he was given an empiric trial of doxycycline however had significant GI side effects and discontinue the medication -Symptoms subsequently resolved and he states he has been doing well since that time -The patient has no urgency and frequency. PVR is 23 mL.  -Denies hematuria.  -Continues to take sildenafil 80 mg for ED.   -He has a history of anaphylaxis with iodinated contrast.   PMH: Past Medical History:  Diagnosis Date   Arthritis    Depression    Improved   GERD (gastroesophageal reflux disease)     Surgical History: Past Surgical History:  Procedure Laterality Date   ANTERIOR LUMBAR FUSION  1986   BACK SURGERY  2005   Lumbar rod   INGUINAL HERNIA REPAIR Right 1989   KNEE SURGERY Left 1996, 2004   [place] x2   L2-3 and 3-4   LUMBAR LAMINECTOMY  1983    Home Medications:  Allergies as of 04/19/2020      Reactions   Iodinated Diagnostic Agents Anaphylaxis, Shortness Of Breath   Other Anaphylaxis   Contrast Dye      Medication List       Accurate as of April 19, 2020  1:54 PM. If you have any  questions, ask your nurse or doctor.        STOP taking these medications   tamsulosin 0.4 MG Caps capsule Commonly known as: FLOMAX Stopped by: Vincent Sons, MD     TAKE these medications   Advair Diskus 250-50 MCG/DOSE Aepb Generic drug: Fluticasone-Salmeterol INHALE 1 PUFF INTO THE LUNGS TWICE A DAY. RINSE MOUTH AFTER EACH USE   HYDROmorphone 4 MG tablet Commonly known as: DILAUDID Take by mouth every 4 (four) hours as needed for severe pain. Takes 8 mg qam and 8 mg qhs   ProAir HFA 108 (90 Base) MCG/ACT inhaler Generic drug: albuterol INHALE 2 PUFFS BY MOUTH EVERY 4-6 HOURS AS NEEDED   sildenafil 20 MG tablet Commonly known as: REVATIO TAKE 2-5 TABLETS BY MOUTH DAILY AS NEEDED   Tums 500 MG chewable tablet Generic drug: calcium carbonate Chew by mouth.       Allergies:  Allergies  Allergen Reactions   Iodinated Diagnostic Agents Anaphylaxis and Shortness Of Breath   Other Anaphylaxis    Contrast Dye    Family History: Family History  Problem Relation Age of Onset   Arthritis Mother    Cancer Mother        Cancer - Abdominal   Arthritis Father    Stroke Father        Cerebral Hemorrhage - s/p fall   Dementia Father    Heart disease Brother    Early death Brother  Murdered   Prostate cancer Neg Hx    Bladder Cancer Neg Hx    Kidney cancer Neg Hx     Social History:  reports that he has been smoking. He has a 22.50 pack-year smoking history. He has never used smokeless tobacco. He reports current alcohol use. He reports that he does not use drugs.   Physical Exam: BP (!) 169/91    Pulse (!) 102    Ht 6\' 3"  (1.905 m)    Wt 240 lb (108.9 kg)    BMI 30.00 kg/m   Constitutional:  Alert and oriented, No acute distress. HEENT: [place] AT, moist mucus membranes.  Trachea midline, no masses. Cardiovascular: No clubbing, cyanosis, or edema. Respiratory: Normal respiratory effort, no increased work of breathing. GU: Prostate 30 g, smooth  without nodules or tenderness Rectal: Normal sphincter tone, 30 g prostate, no nodules/tenderness  Skin: No rashes, bruises or suspicious lesions. Neurologic: Grossly intact, no focal deficits, moving all 4 extremities. Psychiatric: Normal mood and affect.   Pertinent Imaging: Results for orders placed or performed in visit on 04/19/20  Bladder Scan (Post Void Residual) in office  Result Value Ref Range   Scan Result 23 ML    CLINICAL DATA:  Chronic microhematuria, bowel changes, chronic pelvic pain.  EXAM: MRI ABDOMEN AND PELVIS WITHOUT AND WITH CONTRAST  TECHNIQUE: Multiplanar multisequence MR imaging of the abdomen and pelvis was performed both before and after the administration of intravenous contrast.  CONTRAST:  28mL GADAVIST GADOBUTROL 1 MMOL/ML IV SOLN  COMPARISON:  None.  FINDINGS: Lower chest: Lung bases are clear.  Hepatobiliary: Liver is within normal limits. No suspicious enhancing hepatic lesions.  Suspected layering gallbladder sludge and/or small gallstones (series 26/image 1). No intrahepatic or extrahepatic duct dilatation.  Pancreas:  Within normal limits.  Spleen:  Within normal limits.  Adrenals/Urinary Tract:  Adrenal glands are within limits.  Scattered small bilateral renal cysts measuring up to 6 mm in the interpolar left kidney. No enhancing renal lesions. No hydronephrosis.  Bladder is within normal limits.  Stomach/Bowel: Stomach is normal limits.  No evidence of obstruction.  Normal appendix (series 5/image 17).  Sigmoid diverticulosis, without evidence of diverticulitis.  Vascular/Lymphatic:  No evidence of abdominal aortic aneurysm.  No suspicious abdominopelvic lymphadenopathy.  Reproductive: Although not tailored for evaluation of the prostate, there is a 23 x 12 mm focal low T2 lesion in the right posteromedial peripheral zone, mid gland to apex (series 6/image 21). This appearance raises concern  for possible for prostate cancer.  Other: No abdominopelvic ascites.  Small fat containing bilateral inguinal hernias.  Musculoskeletal: Prior L4-5 fusion.  Status post PLIF at L2-4.  IMPRESSION: 2.3 cm focal low T2 lesion in the posteromedial right peripheral zone, mid gland to apex. Although not tailored for evaluation of the prostate, this appearance is worrisome for macroscopic prostate cancer.  Bilateral renal cysts, measuring up to 6 mm benign (Bosniak I). No enhancing renal lesions.  Sigmoid diverticulosis, without evidence of diverticulitis.  Suspected cholelithiasis and/or layering gallbladder sludge.   Electronically Signed   By: Julian Hy M.D.   On: 06/24/2019 16:40   I have personally reviewed the images and agree with radiologist interpretation.     Assessment & Plan:    1. BPH with LUTS No bothersome urinary symptoms at this time. PVR is 23 mL.  PSA today, if stable plans is to repeat in 1 year. Will call with results.   2.  Pelvic pain Resolved.  Most likely  secondary to prostatitis  3. Erectile dysfunction  On sildenafil 80 mg Medication was refilled  4.  Abnormal MRI Recommended a follow-up dedicated prostate MRI based on the above findings though feel the likelihood of prostate cancer extremely low with benign DRE and low PSA  He wanted to hold off on MRI for at least a year unless his PSA is increased  He indicated he was willing to accept the risk of delayed diagnosis prostate cancer  Follow up in 1 year with PSA.    [facility] [address], [place], [place] [address] [phone]  I, Selena Batten, am acting as a scribe for Dr. Nicki Reaper C. Bronson Bressman,  I have reviewed the above documentation for accuracy and completeness, and I agree with the above.   Vincent Sons, MD

## 2020-04-19 ENCOUNTER — Other Ambulatory Visit: Payer: Self-pay

## 2020-04-19 ENCOUNTER — Ambulatory Visit (INDEPENDENT_AMBULATORY_CARE_PROVIDER_SITE_OTHER): Payer: Medicare HMO | Admitting: Urology

## 2020-04-19 ENCOUNTER — Encounter: Payer: Self-pay | Admitting: Urology

## 2020-04-19 VITALS — BP 169/91 | HR 102 | Ht 75.0 in | Wt 240.0 lb

## 2020-04-19 DIAGNOSIS — R935 Abnormal findings on diagnostic imaging of other abdominal regions, including retroperitoneum: Secondary | ICD-10-CM | POA: Insufficient documentation

## 2020-04-19 DIAGNOSIS — N401 Enlarged prostate with lower urinary tract symptoms: Secondary | ICD-10-CM | POA: Diagnosis not present

## 2020-04-19 DIAGNOSIS — N5201 Erectile dysfunction due to arterial insufficiency: Secondary | ICD-10-CM | POA: Diagnosis not present

## 2020-04-19 LAB — BLADDER SCAN AMB NON-IMAGING: Scan Result: 23

## 2020-04-19 MED ORDER — SILDENAFIL CITRATE 20 MG PO TABS
ORAL_TABLET | ORAL | 0 refills | Status: DC
Start: 2020-04-19 — End: 2021-02-06

## 2020-04-20 ENCOUNTER — Telehealth: Payer: Self-pay | Admitting: *Deleted

## 2020-04-20 LAB — PSA: Prostate Specific Ag, Serum: 0.8 ng/mL (ref 0.0–4.0)

## 2020-04-20 NOTE — Telephone Encounter (Signed)
Notified patient as instructed, patient pleased. Discussed follow-up appointments, patient agrees  

## 2020-04-20 NOTE — Telephone Encounter (Signed)
-----   Message from Abbie Sons, MD sent at 04/20/2020  6:46 AM EDT ----- PSA remains low and stable at 0.8.  Follow-up as scheduled.

## 2020-06-14 IMAGING — MR MR PELVIS WO/W CM
28 series · 48 of 48 positions shown · IV contrast (10ml Gadavist)
Comparison: None.

CLINICAL DATA: Chronic microhematuria, bowel changes, chronic
pelvic pain.

EXAM:
MRI ABDOMEN AND PELVIS WITHOUT AND WITH CONTRAST
TECHNIQUE: Multiplanar multisequence MR imaging of the abdomen and pelvis was
performed both before and after the administration of intravenous
contrast.
CONTRAST:  10mL GADAVIST GADOBUTROL 1 MMOL/ML IV SOLN

[Series 4: T2 · coronal · 6.0mm · 1.19mm/px · 2 of 30 slices shown (1 of 5)]
[im 1/30]
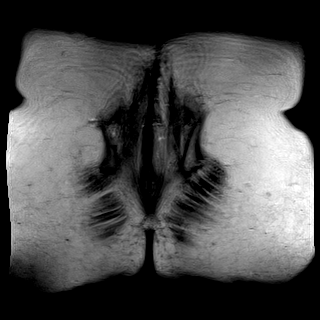
[im 30/30]
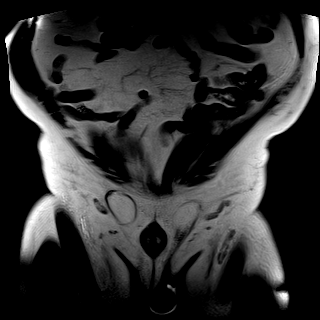

[Series 5: T2 · coronal · 6.0mm · 1.19mm/px · 2 of 30 slices shown (2 of 5)]
[im 1/30]
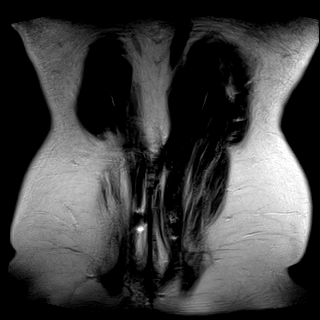
[im 30/30]
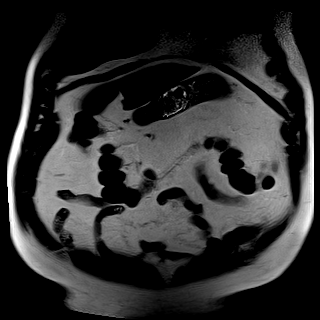

[Series 6: T2 · axial · 6.0mm · 1.19mm/px · z∈[-86,+123]mm · 2 of 30 slices shown (3 of 5)]
[im 1/30]
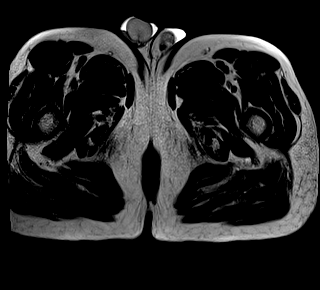
[im 30/30]
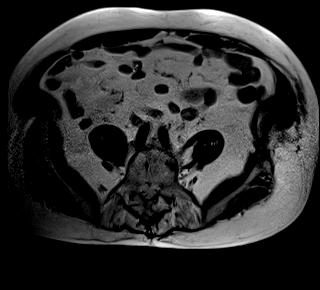

[Series 7: T2 · sagittal · 6.0mm · 1.19mm/px · 2 of 30 slices shown (4 of 5)]
[im 1/30]
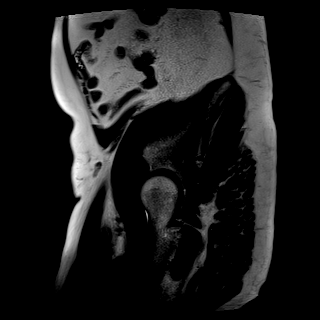
[im 30/30]
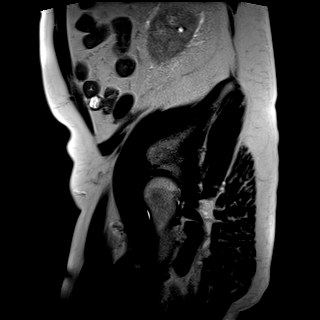

[Series 8: T1 · axial · 3.0mm · 1.19mm/px · z∈[-88,+125]mm · 2 of 72 slices shown (1 of 3)]
[im 1/72]
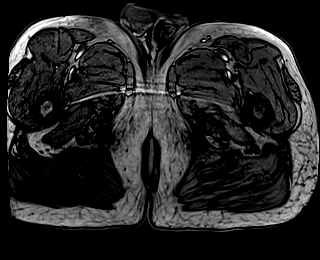
[im 72/72]
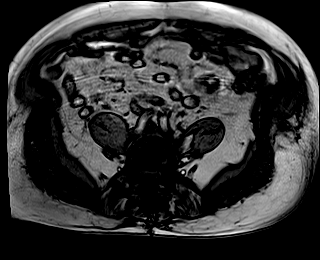

[Series 9: T1 fat-sat · axial · 3.0mm · 1.19mm/px · z∈[-88,+125]mm · 2 of 72 slices shown (1 of 3)]
[im 1/72]
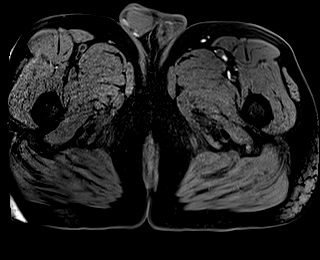
[im 72/72]
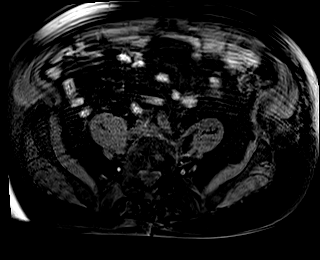

[Series 10: bSSFP · axial · 6.0mm · 0.74mm/px · 1 of 30 slices shown]
[im 1/30]
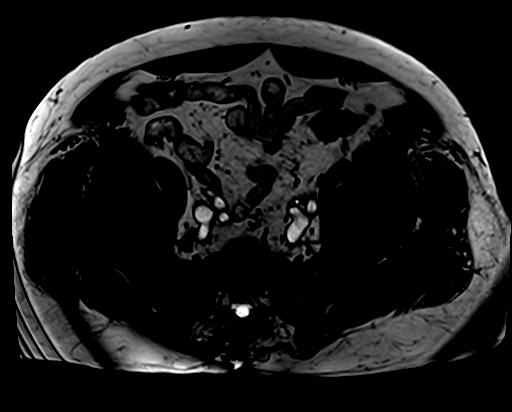

[Series 12: DWI · axial · 6.0mm · 1.42mm/px · 1 of 32 slices shown (1 of 4)]
[im 1/32]
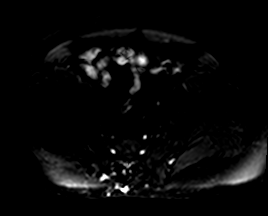

[Series 12: DWI · axial · 6.0mm · 1.42mm/px · 1 of 32 slices shown (2 of 4)]
[im 1/32]
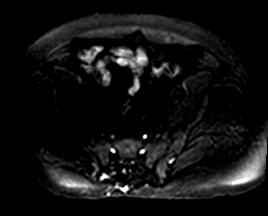

[Series 12: DWI · axial · 6.0mm · 1.42mm/px · 1 of 32 slices shown (3 of 4)]
[im 1/32]
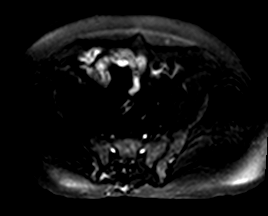

[Series 13: DWI · axial · 6.0mm · 1.42mm/px · 1 of 32 slices shown (4 of 4)]
[im 1/32]
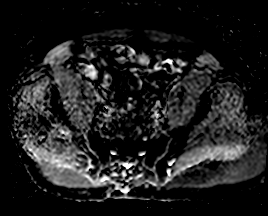

[Series 15: T2 fat-sat · axial · 6.0mm · 1.19mm/px · 1 of 30 slices shown]
[im 1/30]
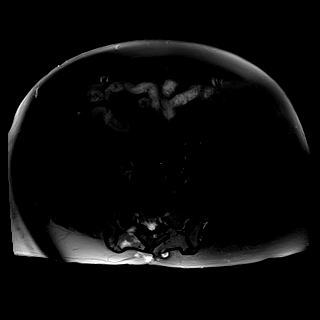

[Series 16: T1 · axial · 6.0mm · 0.74mm/px · 1 of 30 slices shown (2 of 3)]
[im 1/30]
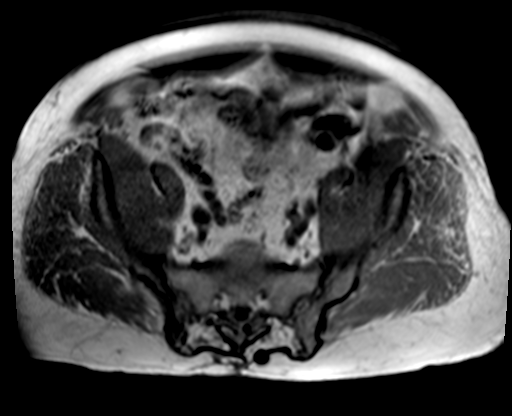

[Series 16: T1 · axial · 6.0mm · 0.74mm/px · 1 of 30 slices shown (3 of 3)]
[im 1/30]
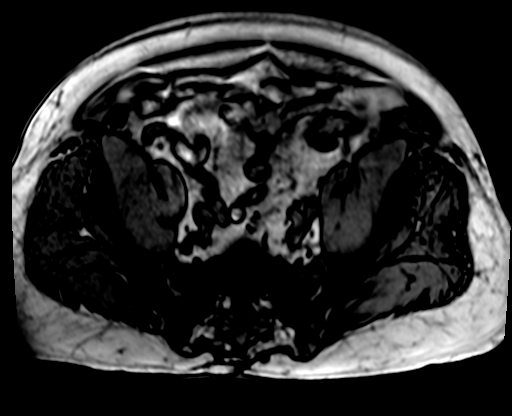

[Series 17: T2 · axial · 6.0mm · 1.19mm/px · 1 of 30 slices shown (5 of 5)]
[im 1/30]
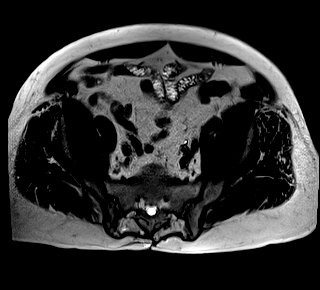

[Series 18: T1 dynamic fat-sat · axial · non-contrast · 3.0mm · 1.19mm/px · z∈[+77,+290]mm · 2 of 72 slices shown]
[im 1/72]
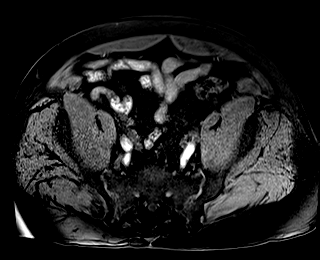
[im 72/72]
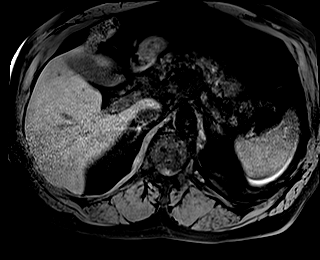

[Series 19: T1 dynamic fat-sat post-contrast · axial · 3.0mm · 1.19mm/px · z∈[+77,+290]mm · 2 of 72 slices shown (1 of 8)]
[im 1/72]
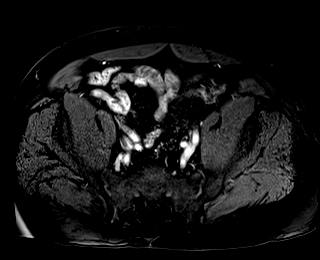
[im 72/72]
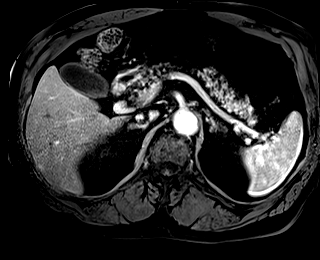

[Series 20: T1 dynamic fat-sat post-contrast · axial · 3.0mm · 1.19mm/px · z∈[+77,+290]mm · 2 of 72 slices shown (2 of 8)]
[im 1/72]
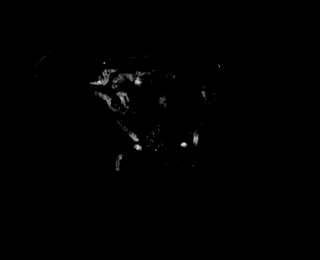
[im 72/72]
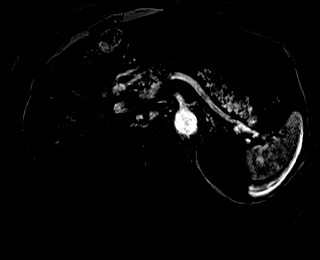

[Series 21: T1 dynamic fat-sat post-contrast · axial · 3.0mm · 1.19mm/px · z∈[+77,+290]mm · 2 of 72 slices shown (3 of 8)]
[im 1/72]
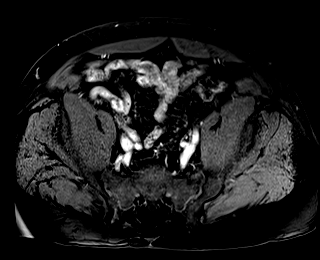
[im 72/72]
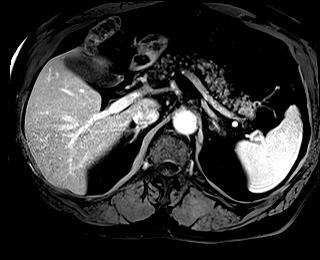

[Series 22: T1 dynamic fat-sat post-contrast · axial · 3.0mm · 1.19mm/px · z∈[+77,+290]mm · 2 of 72 slices shown (4 of 8)]
[im 1/72]
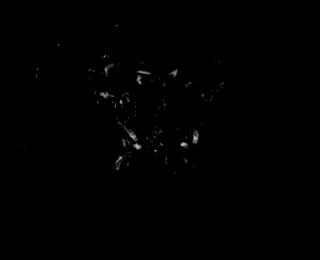
[im 72/72]
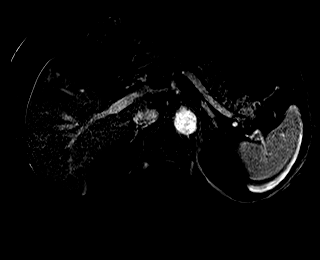

[Series 23: T1 dynamic fat-sat post-contrast · axial · 3.0mm · 1.19mm/px · z∈[+77,+290]mm · 2 of 72 slices shown (5 of 8)]
[im 1/72]
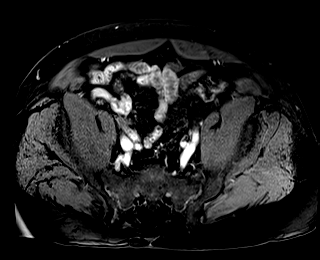
[im 72/72]
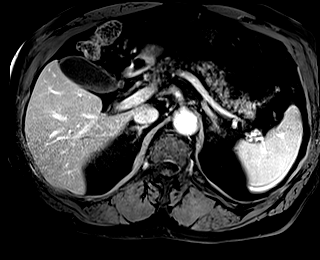

[Series 24: T1 dynamic fat-sat post-contrast · axial · 3.0mm · 1.19mm/px · z∈[+77,+290]mm · 2 of 72 slices shown (6 of 8)]
[im 1/72]
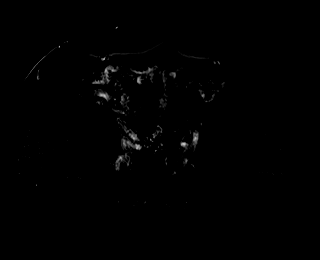
[im 72/72]
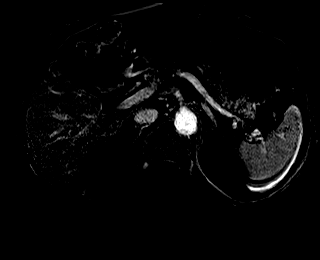

[Series 25: T1 dynamic post-contrast · coronal · 3.0mm · 1.31mm/px · 2 of 72 slices shown (1 of 2)]
[im 1/72]
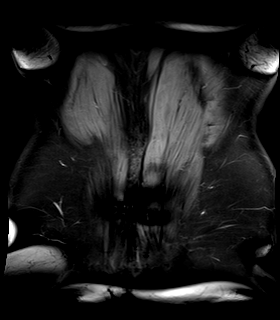
[im 72/72]
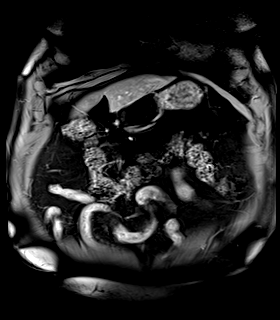

[Series 26: T1 dynamic fat-sat post-contrast · axial · 3.0mm · 1.19mm/px · z∈[+77,+290]mm · 2 of 72 slices shown (7 of 8)]
[im 1/72]
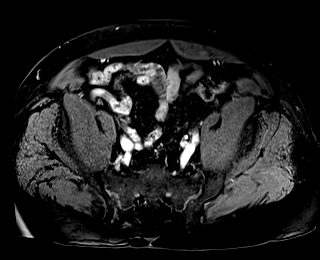
[im 72/72]
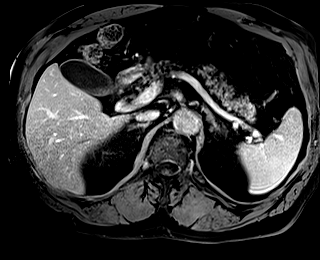

[Series 27: T1 dynamic fat-sat post-contrast · axial · 3.0mm · 1.19mm/px · z∈[+77,+290]mm · 2 of 72 slices shown (8 of 8)]
[im 1/72]
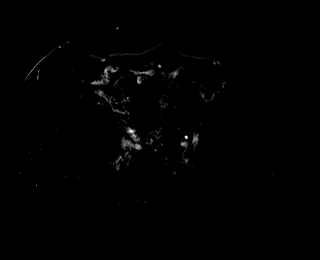
[im 72/72]
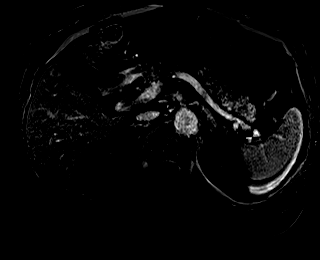

[Series 28: T1 fat-sat · axial · 3.0mm · 1.19mm/px · z∈[-88,+125]mm · 2 of 72 slices shown (2 of 3)]
[im 1/72]
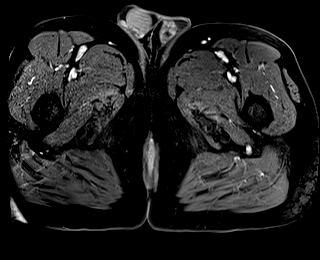
[im 72/72]
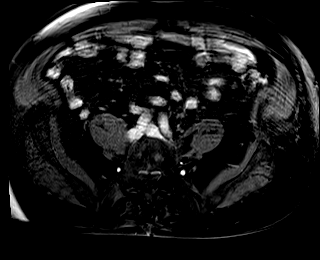

[Series 29: T1 fat-sat · coronal · 3.0mm · 1.19mm/px · 2 of 72 slices shown (3 of 3)]
[im 1/72]
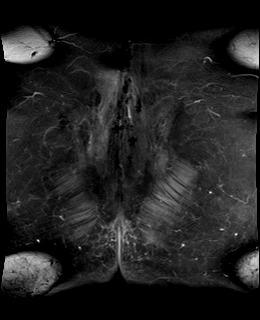
[im 72/72]
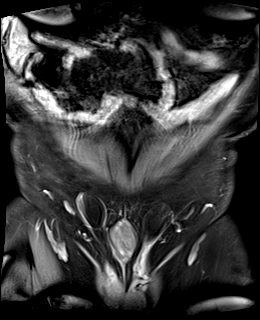

[Series 30: T1 dynamic post-contrast · coronal · 3.0mm · 1.31mm/px · 3 of 80 slices shown (2 of 2)]
[im 1/80]
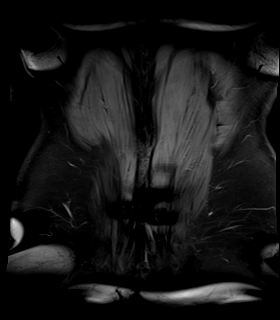
[im 40/80]
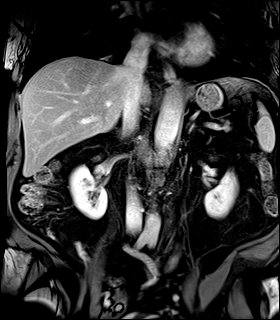
[im 80/80]
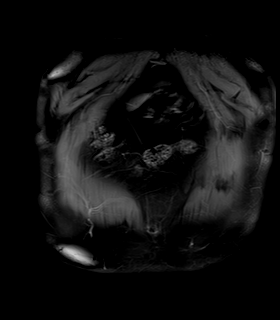

[48 of 48 positions shown; findings below may reference images not displayed]

FINDINGS: Lower chest: Lung bases are clear.

Hepatobiliary: Liver is within normal limits. No suspicious
enhancing hepatic lesions.

Suspected layering gallbladder sludge and/or small gallstones
(series 26/image 1). No intrahepatic or extrahepatic duct
dilatation.

Pancreas:  Within normal limits.

Spleen:  Within normal limits.

Adrenals/Urinary Tract:  Adrenal glands are within limits.

Scattered small bilateral renal cysts measuring up to 6 mm in the
interpolar left kidney. No enhancing renal lesions. No
hydronephrosis.

Bladder is within normal limits.

Stomach/Bowel: Stomach is normal limits.

No evidence of obstruction.

Normal appendix (series 5/image 17).

Sigmoid diverticulosis, without evidence of diverticulitis.

Vascular/Lymphatic:  No evidence of abdominal aortic aneurysm.

No suspicious abdominopelvic lymphadenopathy.

Reproductive: Although not tailored for evaluation of the prostate,
there is a 23 x 12 mm focal low T2 lesion in the right posteromedial
peripheral zone, mid gland to apex (series 6/image 21). This
appearance raises concern for possible for prostate cancer.

Other: No abdominopelvic ascites.

Small fat containing bilateral inguinal hernias.

Musculoskeletal: Prior L4-5 fusion.  Status post PLIF at L2-4.
IMPRESSION: 2.3 cm focal low T2 lesion in the posteromedial right peripheral
zone, mid gland to apex. Although not tailored for evaluation of the
prostate, this appearance is worrisome for macroscopic prostate
cancer.

Bilateral renal cysts, measuring up to 6 mm benign (Bosniak I). No
enhancing renal lesions.

Sigmoid diverticulosis, without evidence of diverticulitis.

Suspected cholelithiasis and/or layering gallbladder sludge.

## 2021-02-06 ENCOUNTER — Other Ambulatory Visit: Payer: Self-pay | Admitting: Urology

## 2021-02-06 MED ORDER — SILDENAFIL CITRATE 20 MG PO TABS: ORAL_TABLET | ORAL | 0 refills | Status: DC

## 2021-03-12 ENCOUNTER — Other Ambulatory Visit: Payer: Self-pay | Admitting: *Deleted

## 2021-03-12 DIAGNOSIS — N401 Enlarged prostate with lower urinary tract symptoms: Secondary | ICD-10-CM

## 2021-03-12 NOTE — Progress Notes (Signed)
p 

## 2021-04-19 ENCOUNTER — Other Ambulatory Visit: Payer: Self-pay

## 2021-04-22 ENCOUNTER — Ambulatory Visit: Payer: Self-pay | Admitting: Urology

## 2021-04-23 ENCOUNTER — Other Ambulatory Visit: Payer: Medicare HMO

## 2021-04-23 ENCOUNTER — Other Ambulatory Visit: Payer: Self-pay

## 2021-04-23 DIAGNOSIS — N401 Enlarged prostate with lower urinary tract symptoms: Secondary | ICD-10-CM

## 2021-04-24 ENCOUNTER — Ambulatory Visit: Payer: Self-pay | Admitting: Urology

## 2021-04-24 LAB — PSA: Prostate Specific Ag, Serum: 0.9 ng/mL (ref 0.0–4.0)

## 2021-05-02 ENCOUNTER — Ambulatory Visit: Payer: Medicare HMO | Admitting: Urology

## 2021-05-02 ENCOUNTER — Encounter: Payer: Self-pay | Admitting: Urology

## 2021-05-02 ENCOUNTER — Other Ambulatory Visit: Payer: Self-pay

## 2021-05-02 VITALS — BP 175/84 | HR 90 | Ht 75.0 in | Wt 240.0 lb

## 2021-05-02 DIAGNOSIS — N5201 Erectile dysfunction due to arterial insufficiency: Secondary | ICD-10-CM

## 2021-05-02 DIAGNOSIS — N401 Enlarged prostate with lower urinary tract symptoms: Secondary | ICD-10-CM | POA: Diagnosis not present

## 2021-05-02 MED ORDER — SILDENAFIL CITRATE 20 MG PO TABS: ORAL_TABLET | ORAL | 0 refills | Status: DC

## 2021-05-02 NOTE — Progress Notes (Signed)
05/02/2021 2:48 PM   Jenny Reichmann T [place] Aug 27, 1950 [id number]  Referring provider: [facility] [address] [place],  [place] [address]  Chief Complaint  Patient presents with   Elevated PSA    Urologic history: 1.  BPH with lower urinary tract symptoms -Mild voiding symptoms -Cystoscopy BPH, hypervascularity, inflammatory change   2.  History chronic prostatitis   3.  Erectile dysfunction  -Sildenafil 80 mg  HPI: 70 y.o. male presents for annual follow-up.  Doing well since last visit No bothersome LUTS Denies dysuria, gross hematuria Denies flank, abdominal or pelvic pain Sildenafil effective for his ED PSA 04/23/2021 stable at 0.9  PMH: Past Medical History:  Diagnosis Date   Arthritis    Depression    Improved   GERD (gastroesophageal reflux disease)     Surgical History: Past Surgical History:  Procedure Laterality Date   ANTERIOR LUMBAR FUSION  1986   BACK SURGERY  2005   Lumbar rod   INGUINAL HERNIA REPAIR Right 1989   KNEE SURGERY Left 1996, 2004   [place] x2   L2-3 and 3-4   LUMBAR LAMINECTOMY  1983    Home Medications:  Allergies as of 05/02/2021       Reactions   Iodinated Diagnostic Agents Anaphylaxis, Shortness Of Breath   Other Anaphylaxis   Contrast Dye        Medication List        Accurate as of May 02, 2021  2:48 PM. If you have any questions, ask your nurse or doctor.          albuterol 108 (90 Base) MCG/ACT inhaler Commonly known as: VENTOLIN HFA INHALE 2 PUFFS BY MOUTH EVERY 4-6 HOURS AS NEEDED   calcium carbonate 500 MG chewable tablet Commonly known as: TUMS - dosed in mg elemental calcium Chew by mouth.   Fluticasone-Salmeterol 250-50 MCG/DOSE Aepb Commonly known as: ADVAIR INHALE 1 PUFF INTO THE LUNGS TWICE A DAY. RINSE MOUTH AFTER EACH USE   HYDROmorphone 4 MG tablet Commonly known as: DILAUDID Take by mouth every 4 (four) hours as needed for severe pain. Takes 8 mg qam and  8 mg qhs   sildenafil 20 MG tablet Commonly known as: REVATIO TAKE 2-5 TABLETS BY MOUTH DAILY AS NEEDED        Allergies:  Allergies  Allergen Reactions   Iodinated Diagnostic Agents Anaphylaxis and Shortness Of Breath   Other Anaphylaxis    Contrast Dye    Family History: Family History  Problem Relation Age of Onset   Arthritis Mother    Cancer Mother        Cancer - Abdominal   Arthritis Father    Stroke Father        Cerebral Hemorrhage - s/p fall   Dementia Father    Heart disease Brother    Early death Brother        Murdered   Prostate cancer Neg Hx    Bladder Cancer Neg Hx    Kidney cancer Neg Hx     Social History:  reports that he has been smoking. He has a 22.50 pack-year smoking history. He has never used smokeless tobacco. He reports current alcohol use. He reports that he does not use drugs.   Physical Exam: BP (!) 175/84   Pulse 90   Ht '6\' 3"'$  (1.905 m)   Wt 240 lb (108.9 kg)   BMI 30.00 kg/m   Constitutional:  Alert and oriented, No  acute distress. HEENT: [place] AT, moist mucus membranes.  Trachea midline, no masses. Cardiovascular: No clubbing, cyanosis, or edema. Respiratory: Normal respiratory effort, no increased work of breathing. GU: Declined DRE Lymph: No cervical or inguinal lymphadenopathy. Skin: No rashes, bruises or suspicious lesions. Neurologic: Grossly intact, no focal deficits, moving all 4 extremities. Psychiatric: Normal mood and affect.   Assessment & Plan:    1.  BPH with LUTS Stable mild LUTS which are not bothersome  2.  Erectile dysfunction Stable on sildenafil Refill sent to pharmacy  Continue annual follow-up  Abbie Sons, [facility] [address] [place], [place] [address] [phone]

## 2021-05-06 ENCOUNTER — Encounter: Payer: Self-pay | Admitting: Urology

## 2021-06-03 ENCOUNTER — Emergency Department
Admission: EM | Admit: 2021-06-03 | Discharge: 2021-06-03 | Disposition: A | Discharge status: Home / Self Care | Payer: Medicare HMO | Attending: Emergency Medicine | Admitting: Emergency Medicine

## 2021-06-03 DIAGNOSIS — Z7951 Long term (current) use of inhaled steroids: Secondary | ICD-10-CM | POA: Insufficient documentation

## 2021-06-03 DIAGNOSIS — X58XXXA Exposure to other specified factors, initial encounter: Secondary | ICD-10-CM | POA: Diagnosis not present

## 2021-06-03 DIAGNOSIS — F17210 Nicotine dependence, cigarettes, uncomplicated: Secondary | ICD-10-CM | POA: Insufficient documentation

## 2021-06-03 DIAGNOSIS — J449 Chronic obstructive pulmonary disease, unspecified: Secondary | ICD-10-CM | POA: Diagnosis not present

## 2021-06-03 DIAGNOSIS — L299 Pruritus, unspecified: Secondary | ICD-10-CM | POA: Diagnosis not present

## 2021-06-03 DIAGNOSIS — T782XXA Anaphylactic shock, unspecified, initial encounter: Secondary | ICD-10-CM | POA: Diagnosis not present

## 2021-06-03 MED ORDER — EPINEPHRINE 0.3 MG/0.3ML IJ SOAJ: 0.3000 mg | INTRAMUSCULAR | 1 refills | Status: DC | PRN
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
## 2021-06-03 NOTE — ED Notes (Signed)
Family updated as to patient's status.

## 2021-10-29 ENCOUNTER — Other Ambulatory Visit: Payer: Self-pay | Admitting: Urology

## 2021-12-11 ENCOUNTER — Other Ambulatory Visit: Payer: Self-pay | Admitting: Urology

## 2022-02-07 ENCOUNTER — Ambulatory Visit: Payer: Medicare HMO | Admitting: Urology

## 2022-02-07 ENCOUNTER — Encounter: Payer: Self-pay | Admitting: Urology

## 2022-02-07 VITALS — BP 170/81 | HR 85 | Ht 75.0 in | Wt 247.0 lb

## 2022-02-07 DIAGNOSIS — R3912 Poor urinary stream: Secondary | ICD-10-CM

## 2022-02-07 DIAGNOSIS — N5201 Erectile dysfunction due to arterial insufficiency: Secondary | ICD-10-CM | POA: Diagnosis not present

## 2022-02-07 DIAGNOSIS — N401 Enlarged prostate with lower urinary tract symptoms: Secondary | ICD-10-CM | POA: Diagnosis not present

## 2022-02-07 LAB — URINALYSIS, COMPLETE
Bilirubin, UA: NEGATIVE
Glucose, UA: NEGATIVE
Ketones, UA: NEGATIVE
Leukocytes,UA: NEGATIVE
Nitrite, UA: NEGATIVE
Protein,UA: NEGATIVE
Specific Gravity, UA: 1.01 (ref 1.005–1.030)
Urobilinogen, Ur: 0.2 mg/dL (ref 0.2–1.0)
pH, UA: 5 (ref 5.0–7.5)

## 2022-02-07 LAB — BLADDER SCAN AMB NON-IMAGING

## 2022-02-07 LAB — MICROSCOPIC EXAMINATION: Bacteria, UA: NONE SEEN

## 2022-02-07 MED ORDER — ALFUZOSIN HCL ER 10 MG PO TB24: 10.0000 mg | ORAL_TABLET | Freq: Every day | ORAL | 3 refills | Status: DC

## 2022-02-07 MED ORDER — SILDENAFIL CITRATE 20 MG PO TABS: ORAL_TABLET | ORAL | 0 refills | Status: DC

## 2022-02-07 NOTE — Progress Notes (Signed)
02/07/2022 12:41 PM   Vincent Hester 06/16/1951 [id number]  Referring provider: [facility] [place],  [place] [address]  Chief Complaint  Patient presents with   Benign Prostatic Hypertrophy    Urologic history: 1.  BPH with lower urinary tract symptoms -Mild voiding symptoms -Cystoscopy BPH, hypervascularity, inflammatory change   2.  History chronic prostatitis   3.  Erectile dysfunction  -Sildenafil 80 mg  HPI: 71 y.o. male called for an acute visit for bothersome lower urinary tract symptoms  1 week history bothersome LUTS including frequency, weak stream, sensation of incomplete emptying and nocturia x2 No dysuria or gross hematuria Does have perineal discomfort No over-the-counter decongestants Started taking a prostate OTC supplement and felt symptoms started after taking this IPSS today 12/35 Has been on alpha-blocker in the past which has worked well.  He states last was alfuzosin which was effective   PMH: Past Medical History:  Diagnosis Date   Arthritis    Depression    Improved   GERD (gastroesophageal reflux disease)     Surgical History: Past Surgical History:  Procedure Laterality Date   ANTERIOR LUMBAR FUSION  1986   BACK SURGERY  2005   Lumbar rod   INGUINAL HERNIA REPAIR Right 1989   KNEE SURGERY Left 1996, 2004   [place] x2   L2-3 and 3-4   LUMBAR LAMINECTOMY  1983    Home Medications:  Allergies as of 02/07/2022       Reactions   Iodinated Contrast Media Anaphylaxis, Shortness Of Breath   Other Anaphylaxis   Contrast Dye        Medication List        Accurate as of February 07, 2022 12:41 PM. If you have any questions, ask your nurse or doctor.          albuterol 108 (90 Base) MCG/ACT inhaler Commonly known as: VENTOLIN HFA INHALE 2 PUFFS BY MOUTH EVERY 4-6 HOURS AS NEEDED   calcium carbonate 500 MG chewable tablet Commonly known as: TUMS - dosed in mg elemental calcium Chew  by mouth.   cyclobenzaprine 5 MG tablet Commonly known as: FLEXERIL Take by mouth.   DULoxetine HCl 40 MG Cpep Take 1 capsule by mouth daily.   EPINEPHrine 0.3 mg/0.3 mL Soaj injection Commonly known as: EPI-PEN Inject 0.3 mg into the muscle as needed for anaphylaxis.   Fluticasone-Salmeterol 250-50 MCG/DOSE Aepb Commonly known as: ADVAIR INHALE 1 PUFF INTO THE LUNGS TWICE A DAY. RINSE MOUTH AFTER EACH USE   HYDROmorphone 4 MG tablet Commonly known as: DILAUDID Take by mouth every 4 (four) hours as needed for severe pain. Takes 8 mg qam and 8 mg qhs   naloxone 4 MG/0.1ML Liqd nasal spray kit Commonly known as: NARCAN One spray in either nostril once for known/suspected opioid overdose. May repeat every 2-3 minutes in alternating nostril til EMS arrives   sildenafil 20 MG tablet Commonly known as: REVATIO TAKE 2-5 TABLETS BY MOUTH DAILY AS NEEDED        Allergies:  Allergies  Allergen Reactions   Iodinated Contrast Media Anaphylaxis and Shortness Of Breath   Other Anaphylaxis    Contrast Dye    Family History: Family History  Problem Relation Age of Onset   Arthritis Mother    Cancer Mother        Cancer - Abdominal   Arthritis Father    Stroke Father  Cerebral Hemorrhage - s/p fall   Dementia Father    Heart disease Brother    Early death Brother        Murdered   Prostate cancer Neg Hx    Bladder Cancer Neg Hx    Kidney cancer Neg Hx     Social History:  reports that he has been smoking cigarettes. He has a 22.50 pack-year smoking history. He has never used smokeless tobacco. He reports current alcohol use. He reports that he does not use drugs.   Physical Exam: BP (!) 170/81 (BP Location: Left Arm, Patient Position: Sitting, Cuff Size: Large)   Pulse 85   Ht '6\' 3"'  (1.905 m)   Wt 247 lb (112 kg)   BMI 30.87 kg/m   Constitutional:  Alert and oriented, No acute distress. HEENT: [place] AT, moist mucus membranes.  Trachea midline, no  masses. Cardiovascular: No clubbing, cyanosis, or edema. Respiratory: Normal respiratory effort, no increased work of breathing. GU: Prostate 50 g, smooth without nodules Lymph: No cervical or inguinal lymphadenopathy. Skin: No rashes, bruises or suspicious lesions. Neurologic: Grossly intact, no focal deficits, moving all 4 extremities. Psychiatric: Normal mood and affect.   Assessment & Plan:    1.  BPH with LUTS Worsening lower urinary tract symptoms He has a history of chronic prostatitis and may have inflammatory component Rx alfuzosin sent to pharmacy PVR 73 mL Call for worsening voiding symptoms  2.  Erectile dysfunction Stable on sildenafil He requested a refill  Annual follow-up is scheduled September 2023 and he will keep this appointment   Abbie Sons, [facility] [address], [place], [place] [address] [phone]

## 2022-04-21 ENCOUNTER — Other Ambulatory Visit: Payer: Self-pay | Admitting: Urology

## 2022-05-02 ENCOUNTER — Other Ambulatory Visit: Payer: Medicare HMO

## 2022-05-02 DIAGNOSIS — N401 Enlarged prostate with lower urinary tract symptoms: Secondary | ICD-10-CM

## 2022-05-03 LAB — PSA: Prostate Specific Ag, Serum: 0.7 ng/mL (ref 0.0–4.0)

## 2022-05-05 ENCOUNTER — Ambulatory Visit: Payer: Medicare HMO | Admitting: Urology

## 2022-05-07 ENCOUNTER — Ambulatory Visit: Payer: Medicare HMO | Admitting: Urology

## 2022-05-22 ENCOUNTER — Ambulatory Visit: Payer: Medicare HMO | Admitting: Urology

## 2022-06-02 ENCOUNTER — Other Ambulatory Visit: Payer: Self-pay | Admitting: Urology

## 2022-06-04 ENCOUNTER — Encounter: Payer: Self-pay | Admitting: Urology

## 2022-06-04 ENCOUNTER — Ambulatory Visit: Payer: Medicare HMO | Admitting: Urology

## 2022-06-04 VITALS — BP 124/75 | HR 97 | Ht 75.0 in | Wt 243.0 lb

## 2022-06-04 DIAGNOSIS — N401 Enlarged prostate with lower urinary tract symptoms: Secondary | ICD-10-CM

## 2022-06-04 DIAGNOSIS — R3912 Poor urinary stream: Secondary | ICD-10-CM

## 2022-06-04 MED ORDER — ALFUZOSIN HCL ER 10 MG PO TB24: 10.0000 mg | ORAL_TABLET | Freq: Every day | ORAL | 3 refills | Status: DC

## 2022-06-04 NOTE — Progress Notes (Signed)
06/04/2022 2:00 PM   Vincent Hester [birth year] [id number]  Referring provider: [facility] [place],  Vincent Hester  Chief Complaint  Patient presents with   Benign Prostatic Hypertrophy    Urologic history: 1.  BPH with lower urinary tract symptoms -Mild voiding symptoms -Cystoscopy BPH, hypervascularity, inflammatory change -Started alfuzosin June 2023 for worsening voiding symptoms   2.  History chronic prostatitis   3.  Erectile dysfunction  -Sildenafil 80 mg  HPI: 71 y.o. male presents for annual follow-up.  Acute visit June 2023 for worsening lower urinary tract symptoms and started on alfuzosin Noted marked improvement in his voiding pattern and has continue the medication.  When he misses 3-4 days he notes worsening symptoms Denies dysuria, gross hematuria Denies flank, abdominal or pelvic pain Sildenafil effective for his ED PSA 05/02/2022 stable at 0.7  PMH: Past Medical History:  Diagnosis Date   Arthritis    Depression    Improved   GERD (gastroesophageal reflux disease)     Surgical History: Past Surgical History:  Procedure Laterality Date   ANTERIOR LUMBAR FUSION  1986   BACK SURGERY  2005   Lumbar rod   INGUINAL HERNIA REPAIR Right 1989   KNEE SURGERY Left 1996, 2004   [place] x2   L2-3 and 3-4   LUMBAR LAMINECTOMY  1983    Home Medications:  Allergies as of 06/04/2022       Reactions   Iodinated Contrast Media Anaphylaxis, Shortness Of Breath   Other Anaphylaxis   Contrast Dye        Medication List        Accurate as of June 04, 2022  2:00 PM. If you have any questions, ask your nurse or doctor.          albuterol 108 (90 Base) MCG/ACT inhaler Commonly known as: VENTOLIN HFA INHALE 2 PUFFS BY MOUTH EVERY 4-6 HOURS AS NEEDED   alfuzosin 10 MG 24 hr tablet Commonly known as: UROXATRAL TAKE 1 TABLET BY MOUTH DAILY WITH BREAKFAST.   calcium carbonate 500 MG chewable  tablet Commonly known as: TUMS - dosed in mg elemental calcium Chew by mouth.   cyclobenzaprine 5 MG tablet Commonly known as: FLEXERIL Take by mouth.   DULoxetine HCl 40 MG Cpep Take 1 capsule by mouth daily.   EPINEPHrine 0.3 mg/0.3 mL Soaj injection Commonly known as: EPI-PEN Inject 0.3 mg into the muscle as needed for anaphylaxis.   Fluticasone-Salmeterol 250-50 MCG/DOSE Aepb Commonly known as: ADVAIR INHALE 1 PUFF INTO THE LUNGS TWICE A DAY. RINSE MOUTH AFTER EACH USE   HYDROmorphone 4 MG tablet Commonly known as: DILAUDID Take by mouth every 4 (four) hours as needed for severe pain. Takes 8 mg qam and 8 mg qhs   naloxone 4 MG/0.1ML Liqd nasal spray kit Commonly known as: NARCAN One spray in either nostril once for known/suspected opioid overdose. May repeat every 2-3 minutes in alternating nostril til EMS arrives   sildenafil 20 MG tablet Commonly known as: REVATIO TAKE 2-5 TABLETS BY MOUTH AS NEEDED FOR ERECTILE DYSFUNCTION        Allergies:  Allergies  Allergen Reactions   Iodinated Contrast Media Anaphylaxis and Shortness Of Breath   Other Anaphylaxis    Contrast Dye    Family History: Family History  Problem Relation Age of Onset   Arthritis Mother    Cancer Mother        Cancer - Abdominal  Arthritis Father    Stroke Father        Cerebral Hemorrhage - s/p fall   Dementia Father    Heart disease Brother    Early death Brother        Murdered   Prostate cancer Neg Hx    Bladder Cancer Neg Hx    Kidney cancer Neg Hx     Social History:  reports that he has been smoking cigarettes. He has a 22.50 pack-year smoking history. He has never used smokeless tobacco. He reports current alcohol use. He reports that he does not use drugs.   Physical Exam: BP 124/75   Pulse 97   Ht '6\' 3"'  (1.905 m)   Wt 243 lb (110.2 kg)   BMI 30.37 kg/m   Constitutional:  Alert and oriented, No acute distress. HEENT: [place] AT Respiratory: Normal respiratory effort,  no increased work of breathing. GU: DRE performed June 2023 Psychiatric: Normal mood and affect.   Assessment & Plan:    1.  BPH with LUTS Significant improvement in voiding symptoms on alfuzosin Refill sent to pharmacy  Continue annual follow-up    Abbie Sons, [facility] [address] [place], [place] [address] [phone]

## 2022-06-16 ENCOUNTER — Other Ambulatory Visit: Payer: Self-pay | Admitting: Urology

## 2022-06-16 ENCOUNTER — Other Ambulatory Visit: Payer: Self-pay

## 2022-06-16 ENCOUNTER — Telehealth: Payer: Self-pay

## 2022-06-16 DIAGNOSIS — Z86010 Personal history of colonic polyps: Secondary | ICD-10-CM

## 2022-06-16 MED ORDER — CLENPIQ 10-3.5-12 MG-GM -GM/160ML PO SOLN: ORAL | 0 refills | Status: DC

## 2022-06-16 MED ORDER — NA SULFATE-K SULFATE-MG SULF 17.5-3.13-1.6 GM/177ML PO SOLN: 1.0000 | Freq: Once | ORAL | 0 refills | Status: AC

## 2022-06-16 NOTE — Telephone Encounter (Signed)
Gastroenterology Pre-Procedure Review  Request Date: 07/02/22 Requesting Physician: Dr. Vicente Males  PATIENT REVIEW QUESTIONS: The patient responded to the following health history questions as indicated:    1. Are you having any GI issues? no 2. Do you have a personal history of Polyps? yes (unsure when the last colonoscopy was but referral noted adenomatous polyps, and patient stated he has history of colon polyps) 3. Do you have a family history of Colon Cancer or Polyps? no 4. Diabetes Mellitus? no 5. Joint replacements in the past 12 months?no 6. Major health problems in the past 3 months?no 7. Any artificial heart valves, MVP, or defibrillator?no    MEDICATIONS & ALLERGIES:    Patient reports the following regarding taking any anticoagulation/antiplatelet therapy:   Plavix, Coumadin, Eliquis, Xarelto, Lovenox, Pradaxa, Brilinta, or Effient? no Aspirin? no  Patient confirms/reports the following medications:  Current Outpatient Medications  Medication Sig Dispense Refill   albuterol (VENTOLIN HFA) 108 (90 Base) MCG/ACT inhaler INHALE 2 PUFFS BY MOUTH EVERY 4-6 HOURS AS NEEDED     alfuzosin (UROXATRAL) 10 MG 24 hr tablet Take 1 tablet (10 mg total) by mouth daily with breakfast. 90 tablet 3   calcium carbonate (TUMS - DOSED IN MG ELEMENTAL CALCIUM) 500 MG chewable tablet Chew by mouth.     cyclobenzaprine (FLEXERIL) 5 MG tablet Take by mouth.     DULoxetine HCl 40 MG CPEP Take 1 capsule by mouth daily.     EPINEPHrine 0.3 mg/0.3 mL IJ SOAJ injection Inject 0.3 mg into the muscle as needed for anaphylaxis. 1 each 1   Fluticasone-Salmeterol (ADVAIR) 250-50 MCG/DOSE AEPB INHALE 1 PUFF INTO THE LUNGS TWICE A DAY. RINSE MOUTH AFTER EACH USE     HYDROmorphone (DILAUDID) 4 MG tablet Take by mouth every 4 (four) hours as needed for severe pain. Takes 8 mg qam and 8 mg qhs     naloxone (NARCAN) nasal spray 4 mg/0.1 mL One spray in either nostril once for known/suspected opioid overdose. May repeat  every 2-3 minutes in alternating nostril til EMS arrives     sildenafil (REVATIO) 20 MG tablet TAKE 2-5 TABLETS BY MOUTH AS NEEDED FOR ERECTILE DYSFUNCTION 60 tablet 0   No current facility-administered medications for this visit.    Patient confirms/reports the following allergies:  Allergies  Allergen Reactions   Iodinated Contrast Media Anaphylaxis and Shortness Of Breath   Other Anaphylaxis    Contrast Dye    No orders of the defined types were placed in this encounter.   AUTHORIZATION INFORMATION Primary Insurance: 1D#: Group #:  Secondary Insurance: 1D#: Group #:  SCHEDULE INFORMATION: Date: 07/02/22 Time: Location: [facility]

## 2022-06-16 NOTE — Addendum Note (Signed)
Addended by: Wayna Chalet on: 06/16/2022 11:57 AM   Modules accepted: Orders

## 2022-06-18 ENCOUNTER — Other Ambulatory Visit: Payer: Self-pay

## 2022-06-18 MED ORDER — NA SULFATE-K SULFATE-MG SULF 17.5-3.13-1.6 GM/177ML PO SOLN: 354.0000 mL | Freq: Once | ORAL | 0 refills | Status: AC

## 2022-07-02 ENCOUNTER — Ambulatory Visit: Payer: Medicare HMO | Admitting: Certified Registered"

## 2022-07-02 ENCOUNTER — Ambulatory Visit
Admission: RE | Admit: 2022-07-02 | Discharge: 2022-07-02 | Disposition: A | Discharge status: Home / Self Care | Payer: Medicare HMO | Attending: Gastroenterology | Admitting: Gastroenterology

## 2022-07-02 ENCOUNTER — Encounter
Admission: RE | Disposition: A | Discharge status: Home / Self Care | Payer: Self-pay | Source: Home / Self Care | Attending: Gastroenterology

## 2022-07-02 ENCOUNTER — Encounter: Payer: Self-pay | Admitting: Gastroenterology

## 2022-07-02 DIAGNOSIS — D125 Benign neoplasm of sigmoid colon: Secondary | ICD-10-CM | POA: Diagnosis not present

## 2022-07-02 DIAGNOSIS — Z1211 Encounter for screening for malignant neoplasm of colon: Secondary | ICD-10-CM | POA: Diagnosis present

## 2022-07-02 DIAGNOSIS — K219 Gastro-esophageal reflux disease without esophagitis: Secondary | ICD-10-CM | POA: Insufficient documentation

## 2022-07-02 DIAGNOSIS — D126 Benign neoplasm of colon, unspecified: Secondary | ICD-10-CM | POA: Diagnosis not present

## 2022-07-02 DIAGNOSIS — Z860101 Personal history of adenomatous and serrated colon polyps: Secondary | ICD-10-CM

## 2022-07-02 DIAGNOSIS — D122 Benign neoplasm of ascending colon: Secondary | ICD-10-CM | POA: Diagnosis not present

## 2022-07-02 DIAGNOSIS — K5730 Diverticulosis of large intestine without perforation or abscess without bleeding: Secondary | ICD-10-CM | POA: Insufficient documentation

## 2022-07-02 DIAGNOSIS — Z86010 Personal history of colonic polyps: Secondary | ICD-10-CM

## 2022-07-02 DIAGNOSIS — F17210 Nicotine dependence, cigarettes, uncomplicated: Secondary | ICD-10-CM | POA: Diagnosis not present

## 2022-07-02 DIAGNOSIS — D120 Benign neoplasm of cecum: Secondary | ICD-10-CM | POA: Diagnosis not present

## 2022-07-02 HISTORY — PX: COLONOSCOPY WITH PROPOFOL: SHX5780

## 2022-07-02 SURGERY — COLONOSCOPY WITH PROPOFOL
Anesthesia: General

## 2022-07-02 MED ORDER — LIDOCAINE HCL (CARDIAC) PF 100 MG/5ML IV SOSY
PREFILLED_SYRINGE | INTRAVENOUS | Status: DC | PRN
  Administered 2022-07-02: 50 mg via INTRAVENOUS

## 2022-07-02 MED ORDER — STERILE WATER FOR IRRIGATION IR SOLN
Status: DC | PRN
  Administered 2022-07-02: 50 mL

## 2022-07-02 MED ORDER — PROPOFOL 10 MG/ML IV BOLUS
INTRAVENOUS | Status: DC | PRN
  Administered 2022-07-02: 80 mg via INTRAVENOUS

## 2022-07-02 MED ORDER — SODIUM CHLORIDE 0.9 % IV SOLN: INTRAVENOUS | Status: DC

## 2022-07-02 MED ORDER — PROPOFOL 500 MG/50ML IV EMUL
INTRAVENOUS | Status: DC | PRN
  Administered 2022-07-02: 140 ug/kg/min via INTRAVENOUS

## 2022-07-02 NOTE — H&P (Signed)
Vincent Bellows, MD [address], [place], [place], [place], [address] [id number], [place], [place], [place], [address] Phone: [phone]  Fax: [phone]  Primary Care Physician:  [facility]   Pre-Procedure History & Physical: HPI:  Vincent Hester is a 70 y.o. male is here for an colonoscopy.   Past Medical History:  Diagnosis Date   Arthritis    Depression    Improved   GERD (gastroesophageal reflux disease)     Past Surgical History:  Procedure Laterality Date   ANTERIOR LUMBAR FUSION  1986   BACK SURGERY  2005   Lumbar rod   INGUINAL HERNIA REPAIR Right 1989   KNEE SURGERY Left 1996, 2004   LUMBAR FUSION  1984, 1985 x2   L2-3 and 3-4   [place]    Prior to Admission medications   Medication Sig Start Date End Date Taking? Authorizing Provider  albuterol (VENTOLIN HFA) 108 (90 Base) MCG/ACT inhaler INHALE 2 PUFFS BY MOUTH EVERY 4-6 HOURS AS NEEDED 10/01/15   [provider]  alfuzosin (UROXATRAL) 10 MG 24 hr tablet Take 1 tablet (10 mg total) by mouth daily with breakfast. 06/04/22   Stoioff, Ronda Fairly, MD  calcium carbonate (TUMS - DOSED IN MG ELEMENTAL CALCIUM) 500 MG chewable tablet Chew by mouth. 04/29/14   [provider]  cyclobenzaprine (FLEXERIL) 5 MG tablet Take by mouth.    [provider]  DULoxetine HCl 40 MG CPEP Take 1 capsule by mouth daily. 10/07/21   [provider]  EPINEPHrine 0.3 mg/0.3 mL IJ SOAJ injection Inject 0.3 mg into the muscle as needed for anaphylaxis. 06/03/21   Rudene Re, MD  Fluticasone-Salmeterol (ADVAIR) 250-50 MCG/DOSE AEPB INHALE 1 PUFF INTO THE LUNGS TWICE A DAY. RINSE MOUTH AFTER [facility] USE 01/02/17   [provider]  HYDROmorphone (DILAUDID) 4 MG tablet Take by mouth every 4 (four) hours as needed for severe pain. Takes 8 mg qam and 8 mg qhs    [provider]  naloxone (NARCAN) nasal spray 4 mg/0.1 mL One spray in either nostril once for known/suspected  opioid overdose. May repeat every 2-3 minutes in alternating nostril til EMS arrives 10/08/21   [provider]  sildenafil (REVATIO) 20 MG tablet TAKE 2-5 TABLETS BY MOUTH AS NEEDED FOR ERECTILE DYSFUNCTION 06/16/22   Stoioff, Ronda Fairly, MD  Sod Picosulfate-Mag Ox-Cit Acd (CLENPIQ) 10-3.5-12 MG-GM -GM/160ML SOLN Take 1 bottle at 5 PM followed by five 8 oz cups of water and repeat 5 hours before procedure. 06/16/22   Vincent Bellows, MD    Allergies as of 06/16/2022 - Review Complete 06/04/2022  Allergen Reaction Noted   Iodinated contrast media Anaphylaxis and Shortness Of Breath 06/02/2013   Other Anaphylaxis 07/08/2013    Family History  Problem Relation Age of Onset   Arthritis Mother    Cancer Mother        Cancer - Abdominal   Arthritis Father    Stroke Father        Cerebral Hemorrhage - s/p fall   Dementia Father    Heart disease Brother    Early death Brother        Murdered   Prostate cancer Neg Hx    Bladder Cancer Neg Hx    Kidney cancer Neg Hx     Social History   Socioeconomic History   Marital status: Married    Spouse name: Angela Nevin   Number of children: 4   Years of education:  12   Highest education level: Not on file  Occupational History   Occupation: Risk analyst    Comment: Disabled  Tobacco Use   Smoking status: Every Day    Packs/day: 0.75    Years: 30.00    Total pack years: 22.50    Types: Cigarettes    Last attempt to quit: 06/24/2013    Years since quitting: 9.0   Smokeless tobacco: Never  Vaping Use   Vaping Use: Former  Substance and Sexual Activity   Alcohol use: Yes    Comment: Occasional    Drug use: No   Sexual activity: Yes    Birth control/protection: None  Other Topics Concern   Not on file  Social History Narrative   Mr. Kisiel grew up in [place], [place]. He currently lives at home with his wife, Angela Nevin, of 25 years. He was married previously for 17 years and had 4 children with his first wife (2 sons, 2 daughters). Mr. Wain worked  as a Risk analyst and is no disabled 2/2 back injury and multiple surgeries. He enjoys riding his motorcycle and spending time with his friends. He also works on cars and restores them. Mr. Vestal also enjoys playing golf.   Social Determinants of Health   Financial Resource Strain: Not on file  Food Insecurity: Not on file  Transportation Needs: Not on file  Physical Activity: Not on file  Stress: Not on file  Social Connections: Not on file  Intimate Partner Violence: Not on file    Review of Systems: See HPI, otherwise negative ROS  Physical Exam: There were no vitals taken for this visit. General:   Alert,  pleasant and cooperative in NAD Head:  Normocephalic and atraumatic. Neck:  Supple; no masses or thyromegaly. Lungs:  Clear throughout to auscultation, normal respiratory effort.    Heart:  +S1, +S2, Regular rate and rhythm, No edema. Abdomen:  Soft, nontender and nondistended. Normal bowel sounds, without guarding, and without rebound.   Neurologic:  Alert and  oriented x4;  grossly normal neurologically.  Impression/Plan: Vincent Hester is here for an colonoscopy to be performed for surveillance due to prior history of colon polyps   Risks, benefits, limitations, and alternatives regarding  colonoscopy have been reviewed with the patient.  Questions have been answered.  All parties agreeable.   Vincent Bellows, MD  07/02/2022, 11:33 AM

## 2022-07-02 NOTE — Transfer of Care (Signed)
Immediate Anesthesia Transfer of Care Note  Patient: Vincent Hester  Procedure(s) Performed: COLONOSCOPY WITH PROPOFOL  Patient Location: Endoscopy Unit  Anesthesia Type:General  Level of Consciousness: drowsy  Airway & Oxygen Therapy: Patient Spontanous Breathing  Post-op Assessment: Report given to RN  Post vital signs: stable  Last Vitals:  Vitals Value Taken Time  BP    Temp    Pulse 73 07/02/22 1249  Resp 15 07/02/22 1249  SpO2 97 % 07/02/22 1249  Vitals shown include unvalidated device data.  Last Pain:  Vitals:   07/02/22 1137  TempSrc: Temporal  PainSc: 5          Complications: No notable events documented.

## 2022-07-02 NOTE — Op Note (Signed)
[facility] Gastroenterology Patient Name: Vincent Hester Procedure Date: 07/02/2022 12:23 PM MRN: [id number] Account #: [id number] Date of Birth: December 18, 1950 Admit Type: Outpatient Age: 71 Room: [facility] ENDO ROOM 3 Gender: Male Note Status: Finalized Instrument Name: Jasper Riling [id number] Procedure:             Colonoscopy Indications:           Surveillance: Personal history of adenomatous polyps                         on last colonoscopy 5 years ago Providers:             [name] MD, MD Referring MD:          [place]:             Monitored Anesthesia Care Complications:         No immediate complications. Procedure:             Pre-Anesthesia Assessment:                        - Prior to the procedure, a History and Physical was                         performed, and patient medications, allergies and                         sensitivities were reviewed. The patient's tolerance                         of previous anesthesia was reviewed.                        - The risks and benefits of the procedure and the                         sedation options and risks were discussed with the                         patient. All questions were answered and informed                         consent was obtained.                        - ASA Grade Assessment: II - A patient with mild                         systemic disease.                        After obtaining informed consent, the colonoscope was                         passed under direct vision. Throughout the procedure,                         the patient's blood pressure, pulse, and oxygen                         saturations were monitored continuously.  The                         Colonoscope was introduced through the anus and                         advanced to the the cecum, identified by the                         appendiceal orifice. The colonoscopy was performed                         with ease. The  patient tolerated the procedure well.                         The quality of the bowel preparation was poor. The                         appendiceal orifice was photographed. Findings:      The perianal and digital rectal examinations were normal.      Four sessile polyps were found in the cecum. The polyps were 4 to 5 mm       in size. These polyps were removed with a cold snare. Resection and       retrieval were complete.      Five sessile polyps were found in the ascending colon. The polyps were 4       to 6 mm in size. These polyps were removed with a cold snare. Resection       and retrieval were complete.      A 5 mm polyp was found in the sigmoid colon. The polyp was sessile. The       polyp was removed with a cold snare. Resection and retrieval were       complete.      Multiple small-mouthed diverticula were found in the sigmoid colon.      The exam was otherwise without abnormality on direct and retroflexion       views. Impression:            - Preparation of the colon was poor.                        - Four 4 to 5 mm polyps in the cecum, removed with a                         cold snare. Resected and retrieved.                        - Five 4 to 6 mm polyps in the ascending colon,                         removed with a cold snare. Resected and retrieved.                        - One 5 mm polyp in the sigmoid colon, removed with a                         cold snare. Resected and retrieved.                        -  Diverticulosis in the sigmoid colon.                        - The examination was otherwise normal on direct and                         retroflexion views. Recommendation:        - Discharge patient to home (with escort).                        - Resume previous diet.                        - Continue present medications.                        - Await pathology results.                        - Repeat colonoscopy in 1 year for surveillance. Procedure Code(s):      --- Professional ---                        [phone], Colonoscopy, flexible; with removal of                         tumor(s), polyp(s), or other lesion(s) by snare                         technique Diagnosis Code(s):     --- Professional ---                        Z86.010, Personal history of colonic polyps                        D12.0, Benign neoplasm of cecum                        D12.2, Benign neoplasm of ascending colon                        D12.5, Benign neoplasm of sigmoid colon                        K57.30, Diverticulosis of large intestine without                         perforation or abscess without bleeding CPT copyright 2022 American Medical Association. All rights reserved. The codes documented in this report are preliminary and upon coder review may  be revised to meet current compliance requirements. [name], MD [name] MD, MD 07/02/2022 12:47:05 PM This report has been signed electronically. Number of Addenda: 0 Note Initiated On: 07/02/2022 12:23 PM Scope Withdrawal Time: 0 hours 14 minutes 12 seconds  Total Procedure Duration: 0 hours 17 minutes 28 seconds  Estimated Blood Loss:  Estimated blood loss: none.      [facility]

## 2022-07-02 NOTE — Anesthesia Preprocedure Evaluation (Signed)
Anesthesia Evaluation  Patient identified by MRN, date of birth, ID band Patient awake    Reviewed: Allergy & Precautions, H&P , NPO status , Patient's Chart, lab work & pertinent test results, reviewed documented beta blocker date and time   Airway Mallampati: I  TM Distance: >3 FB Neck ROM: full    Dental  (+) Dental Advidsory Given, Caps, Missing, Teeth Intact   Pulmonary shortness of breath and with exertion, COPD,  COPD inhaler, neg recent URI, Current Smoker and Patient abstained from smoking.   Pulmonary exam normal breath sounds clear to auscultation       Cardiovascular Exercise Tolerance: Good negative cardio ROS Normal cardiovascular exam Rhythm:regular Rate:Normal     Neuro/Psych  PSYCHIATRIC DISORDERS Anxiety Depression    negative neurological ROS     GI/Hepatic Neg liver ROS,GERD  Controlled,,  Endo/Other  negative endocrine ROS    Renal/GU negative Renal ROS  negative genitourinary   Musculoskeletal   Abdominal   Peds  Hematology negative hematology ROS (+)   Anesthesia Other Findings Past Medical History: No date: Arthritis No date: Depression     Comment:  Improved No date: GERD (gastroesophageal reflux disease)   Reproductive/Obstetrics negative OB ROS                             Anesthesia Physical Anesthesia Plan  ASA: 2  Anesthesia Plan: General   Post-op Pain Management:    Induction: Intravenous  PONV Risk Score and Plan: 1 and Propofol infusion and TIVA  Airway Management Planned:   Additional Equipment:   Intra-op Plan:   Post-operative Plan:   Informed Consent: I have reviewed the patients History and Physical, chart, labs and discussed the procedure including the risks, benefits and alternatives for the proposed anesthesia with the patient or authorized representative who has indicated his/her understanding and acceptance.     Dental  Advisory Given  Plan Discussed with: Anesthesiologist, CRNA and Surgeon  Anesthesia Plan Comments:        Anesthesia Quick Evaluation

## 2022-07-03 ENCOUNTER — Encounter: Payer: Self-pay | Admitting: Gastroenterology

## 2022-07-03 LAB — SURGICAL PATHOLOGY

## 2022-07-09 ENCOUNTER — Encounter: Payer: Self-pay | Admitting: Gastroenterology

## 2022-07-14 NOTE — Anesthesia Postprocedure Evaluation (Signed)
Anesthesia Post Note  Patient: Vincent Hester  Procedure(s) Performed: COLONOSCOPY WITH PROPOFOL  Patient location during evaluation: Endoscopy Anesthesia Type: General Level of consciousness: awake and alert Pain management: pain level controlled Vital Signs Assessment: post-procedure vital signs reviewed and stable Respiratory status: spontaneous breathing, nonlabored ventilation, respiratory function stable and patient connected to nasal cannula oxygen Cardiovascular status: blood pressure returned to baseline and stable Postop Assessment: no apparent nausea or vomiting Anesthetic complications: no   No notable events documented.   Last Vitals:  Vitals:   07/02/22 1250 07/02/22 1300  BP:    Pulse:  73  Resp:    Temp: 36.8 C   SpO2:  95%    Last Pain:  Vitals:   07/02/22 1250  TempSrc: Temporal                 Martha Clan

## 2022-08-12 ENCOUNTER — Other Ambulatory Visit: Payer: Self-pay | Admitting: Urology

## 2022-10-27 ENCOUNTER — Other Ambulatory Visit: Payer: Self-pay | Admitting: *Deleted

## 2022-10-27 MED ORDER — SILDENAFIL CITRATE 20 MG PO TABS: ORAL_TABLET | ORAL | 0 refills | Status: DC

## 2023-01-01 ENCOUNTER — Other Ambulatory Visit: Payer: Self-pay | Admitting: Physician Assistant

## 2023-03-16 ENCOUNTER — Other Ambulatory Visit: Payer: Self-pay | Admitting: Urology

## 2023-06-01 ENCOUNTER — Ambulatory Visit: Payer: Medicare HMO | Admitting: Urology

## 2023-06-05 ENCOUNTER — Ambulatory Visit: Payer: Medicare HMO | Admitting: Urology

## 2023-06-15 ENCOUNTER — Ambulatory Visit
Admission: RE | Admit: 2023-06-15 | Discharge: 2023-06-15 | Disposition: A | Discharge status: Home / Self Care | Payer: Medicare HMO | Source: Ambulatory Visit | Attending: Emergency Medicine | Admitting: Emergency Medicine

## 2023-06-15 DIAGNOSIS — J0140 Acute pansinusitis, unspecified: Secondary | ICD-10-CM

## 2023-06-15 DIAGNOSIS — J441 Chronic obstructive pulmonary disease with (acute) exacerbation: Secondary | ICD-10-CM

## 2023-06-15 MED ORDER — PREDNISONE 10 MG (21) PO TBPK: ORAL_TABLET | Freq: Every day | ORAL | 0 refills | Status: DC

## 2023-06-15 MED ORDER — AZITHROMYCIN 250 MG PO TABS: 250.0000 mg | ORAL_TABLET | Freq: Every day | ORAL | 0 refills | Status: DC

## 2023-06-15 NOTE — ED Notes (Signed)
Provider triaged.  
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
## 2023-06-15 NOTE — Discharge Instructions (Signed)
Begin azithromycin as directed   Begin prednisone as directed to open and relax the airway     You can take Tylenol and/or Ibuprofen as needed for fever reduction and pain relief.   For cough: honey 1/2 to 1 teaspoon (you can dilute the honey in water or another fluid).  You can also use guaifenesin and dextromethorphan for cough. You can use a humidifier for chest congestion and cough.  If you don't have a humidifier, you can sit in the bathroom with the hot shower running.      For sore throat: try warm salt water gargles, cepacol lozenges, throat spray, warm tea or water with lemon/honey, popsicles or ice, or OTC cold relief medicine for throat discomfort.   For congestion: take a daily anti-histamine like Zyrtec, Claritin, and a oral decongestant, such as pseudoephedrine.  You can also use Flonase 1-2 sprays in each nostril daily.   It is important to stay hydrated: drink plenty of fluids (water, gatorade/powerade/pedialyte, juices, or teas) to keep your throat moisturized and help further relieve irritation/discomfort.

## 2023-06-20 ENCOUNTER — Telehealth: Payer: Self-pay | Admitting: Emergency Medicine

## 2023-06-20 MED ORDER — AMOXICILLIN-POT CLAVULANATE 875-125 MG PO TABS: 1.0000 | ORAL_TABLET | Freq: Two times a day (BID) | ORAL | 0 refills | Status: DC

## 2023-06-20 MED ORDER — AMOXICILLIN-POT CLAVULANATE 875-125 MG PO TABS: 1.0000 | ORAL_TABLET | Freq: Two times a day (BID) | ORAL | 0 refills | Status: AC

## 2023-06-20 NOTE — Telephone Encounter (Signed)
Patient's wife called clinic and endorses that patient's symptoms have not improved with use of azithromycin and prednisone which patient endorsed at time of visit clears symptoms typically when this occurs yearly, has been using over-the-counter medications additionally, experiencing sinus pressure and persistent coughing, redness of breath and wheezing has not worsened, will attempt additional antibiotic, Augmentin 10-day course sent to pharmacy, discussed with wife that if symptoms continue to persist that he will need in person evaluation for further management and treatment

## 2023-06-20 NOTE — Telephone Encounter (Signed)
Pharmacy changed

## 2023-07-01 ENCOUNTER — Ambulatory Visit: Payer: Medicare HMO | Admitting: Urology

## 2023-07-06 ENCOUNTER — Other Ambulatory Visit: Payer: Self-pay

## 2023-07-06 DIAGNOSIS — N401 Enlarged prostate with lower urinary tract symptoms: Secondary | ICD-10-CM

## 2023-07-07 ENCOUNTER — Other Ambulatory Visit: Payer: Self-pay | Admitting: Urology

## 2023-07-07 ENCOUNTER — Other Ambulatory Visit: Payer: Medicare HMO

## 2023-07-07 DIAGNOSIS — N401 Enlarged prostate with lower urinary tract symptoms: Secondary | ICD-10-CM

## 2023-07-08 LAB — PSA: Prostate Specific Ag, Serum: 0.9 ng/mL (ref 0.0–4.0)

## 2023-07-15 ENCOUNTER — Ambulatory Visit: Payer: Medicare HMO | Admitting: Urology

## 2023-07-15 ENCOUNTER — Encounter: Payer: Self-pay | Admitting: Urology

## 2023-07-15 VITALS — BP 162/92 | HR 81 | Ht 72.0 in | Wt 250.0 lb

## 2023-07-15 DIAGNOSIS — R3912 Poor urinary stream: Secondary | ICD-10-CM

## 2023-07-15 DIAGNOSIS — N401 Enlarged prostate with lower urinary tract symptoms: Secondary | ICD-10-CM

## 2023-07-15 DIAGNOSIS — N5201 Erectile dysfunction due to arterial insufficiency: Secondary | ICD-10-CM | POA: Diagnosis not present

## 2023-07-15 MED ORDER — ALFUZOSIN HCL ER 10 MG PO TB24: 10.0000 mg | ORAL_TABLET | Freq: Every day | ORAL | 3 refills | Status: AC

## 2023-07-15 NOTE — Progress Notes (Signed)
I, Vincent Hester, acting as a scribe for Vincent Altes, MD., have documented all relevant documentation on the behalf of Vincent Altes, MD, as directed by Vincent Altes, MD while in the presence of Vincent Altes, MD.  07/15/2023 5:57 PM   Vincent Hester [birth year] [id number]  Referring provider: [facility] [address] [place],  [place] [address]  Chief Complaint  Patient presents with   Elevated PSA   Urologic history: 1.  BPH with lower urinary tract symptoms Mild voiding symptoms Cystoscopy BPH, hypervascularity, inflammatory change Started alfuzosin June 2023 for worsening voiding symptoms   2.  History chronic prostatitis   3.  Erectile dysfunction Sildenafil 80 mg  HPI: Vincent Hester is a 72 y.o. male presents for annual follow-up.  No complaints since last year's visit.  Denies dysuria, gross hematuria.  Denies flank, abdominal or pelvic pain Sildenafil effective for his ED PSA 07/07/2023 stable at 0.9  PSA trend   Prostate Specific Ag, Serum  Latest Ref Rng 0.0 - 4.0 ng/mL  04/19/2020 0.8   04/23/2021 0.9   05/02/2022 0.7   07/07/2023 0.9      PMH: Past Medical History:  Diagnosis Date   Arthritis    Depression    Improved   GERD (gastroesophageal reflux disease)     Surgical History: Past Surgical History:  Procedure Laterality Date   ANTERIOR LUMBAR FUSION  1986   BACK SURGERY  2005   Lumbar rod   COLONOSCOPY WITH PROPOFOL N/A 07/02/2022   Procedure: COLONOSCOPY WITH PROPOFOL;  Surgeon: Vincent Mood, MD;  Location: [facility] ENDOSCOPY;  Service: Gastroenterology;  Laterality: N/A;   INGUINAL HERNIA REPAIR Right 1989   KNEE SURGERY Left 1996, 2004   LUMBAR FUSION  1984, 1985 x2   L2-3 and 3-4   LUMBAR LAMINECTOMY  1983    Home Medications:  Allergies as of 07/15/2023       Reactions   Iodinated Contrast Media Anaphylaxis, Shortness Of Breath   Other Anaphylaxis   Contrast Dye        Medication List        Accurate  as of July 15, 2023  5:57 PM. If you have any questions, ask your nurse or doctor.          STOP taking these medications    azithromycin 250 MG tablet Commonly known as: ZITHROMAX Stopped by: Vincent Hester   Clenpiq 10-3.5-12 MG-GM -GM/160ML Soln Generic drug: Sod Picosulfate-Mag Ox-Cit Acd Stopped by: Vincent Hester   EPINEPHrine 0.3 mg/0.3 mL Soaj injection Commonly known as: EPI-PEN Stopped by: Vincent Hester   predniSONE 10 MG (21) Tbpk tablet Commonly known as: STERAPRED UNI-PAK 21 TAB Stopped by: Vincent Hester       TAKE these medications    albuterol 108 (90 Base) MCG/ACT inhaler Commonly known as: VENTOLIN HFA INHALE 2 PUFFS BY MOUTH EVERY 4-6 HOURS AS NEEDED   alfuzosin 10 MG 24 hr tablet Commonly known as: UROXATRAL Take 1 tablet (10 mg total) by mouth daily with breakfast.   calcium carbonate 500 MG chewable tablet Commonly known as: TUMS - dosed in mg elemental calcium Chew by mouth.   cyclobenzaprine 5 MG tablet Commonly known as: FLEXERIL Take by mouth.   DULoxetine HCl 40 MG Cpep Take 1 capsule by mouth daily.   Fluticasone-Salmeterol 250-50 MCG/DOSE Aepb Commonly known as: ADVAIR INHALE 1 PUFF INTO THE LUNGS TWICE A DAY. RINSE MOUTH AFTER [facility]  USE   HYDROmorphone 4 MG tablet Commonly known as: DILAUDID Take by mouth every 4 (four) hours as needed for severe pain. Takes 8 mg qam and 8 mg qhs   naloxone 4 MG/0.1ML Liqd nasal spray kit Commonly known as: NARCAN One spray in either nostril once for known/suspected opioid overdose. May repeat every 2-3 minutes in alternating nostril til EMS arrives   sildenafil 20 MG tablet Commonly known as: REVATIO TAKE 2 TO 5 TABLETS AS NEEDED FOR ERECTILE DYSFUNCTION        Allergies:  Allergies  Allergen Reactions   Iodinated Contrast Media Anaphylaxis and Shortness Of Breath   Other Anaphylaxis    Contrast Dye    Family History: Family History  Problem Relation Age of Onset    Arthritis Mother    Cancer Mother        Cancer - Abdominal   Arthritis Father    Stroke Father        Cerebral Hemorrhage - s/p fall   Dementia Father    Heart disease Brother    Early death Brother        Murdered   Prostate cancer Neg Hx    Bladder Cancer Neg Hx    Kidney cancer Neg Hx     Social History:  reports that he has been smoking cigarettes. He started smoking about 40 years ago. He has a 22.5 pack-year smoking history. He has never used smokeless tobacco. He reports that he does not currently use alcohol. He reports that he does not use drugs.   Physical Exam: BP (!) 162/92   Pulse 81   Ht 6' (1.829 m)   Wt 250 lb (113.4 kg)   BMI 33.91 kg/m   Constitutional:  Alert, No acute distress. HEENT: Vincent Hester AT Respiratory: Normal respiratory effort, no increased work of breathing. Psychiatric: Normal Hester and affect.   Assessment & Plan:    1. BPH with LUTS Stable on alfuzosin-refill sent to pharmacy Continue annual follow-up.   I have reviewed the above documentation for accuracy and completeness, and I agree with the above.   Vincent Altes, MD  [facility] [address], [address] [place], [place] [address] [phone]

## 2023-10-01 ENCOUNTER — Other Ambulatory Visit: Payer: Self-pay | Admitting: Urology

## 2023-11-07 ENCOUNTER — Ambulatory Visit
Admission: RE | Admit: 2023-11-07 | Discharge: 2023-11-07 | Disposition: A | Discharge status: Home / Self Care | Payer: Self-pay | Source: Ambulatory Visit | Attending: Emergency Medicine | Admitting: Emergency Medicine

## 2023-11-07 VITALS — BP 127/80 | HR 76 | Temp 99.0°F | Resp 16

## 2023-11-07 DIAGNOSIS — U071 COVID-19: Secondary | ICD-10-CM

## 2023-11-07 LAB — POC COVID19/FLU A&B COMBO
Covid Antigen, POC: POSITIVE — AB
Influenza A Antigen, POC: NEGATIVE
Influenza B Antigen, POC: NEGATIVE

## 2023-11-07 MED ORDER — PAXLOVID (300/100) 20 X 150 MG & 10 X 100MG PO TBPK: 3.0000 | ORAL_TABLET | Freq: Two times a day (BID) | ORAL | 0 refills | Status: AC

## 2023-11-07 MED ORDER — PAXLOVID (300/100) 20 X 150 MG & 10 X 100MG PO TBPK: 3.0000 | ORAL_TABLET | Freq: Two times a day (BID) | ORAL | 0 refills | Status: DC
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
## 2023-11-07 NOTE — ED Triage Notes (Signed)
 Patient presents to UC for cough, head congestion, sneezing, chills, body aches x 2 days. Treating symptoms with nyquil, cold/flu OTC med. Last known fever yesterday.

## 2023-11-07 NOTE — Discharge Instructions (Addendum)
 Covid 19 is a virus and should steadily improve in time it can take up to 7 to 10 days before you truly start to see a turnaround however things will get better  Per the CDC you will need to quarantine and to you are without fever for 24 hours.  May begin use of Paxlovid so that every morning and every evening for 5 days to help reduce the amount of virus in your body which will turn down symptoms, will need to start medicine by Tuesday evening if you are going to use it  Begin prednisone every morning to help open and relax airway, should help with chest discomfort  You may use Tessalon pill every 8 hours for cough, may use cough syrup every 6 hours for additional comfort, be mindful can make you drowsy    You can take Tylenol and/or Ibuprofen as needed for fever reduction and pain relief.   For cough: honey 1/2 to 1 teaspoon (you can dilute the honey in water or another fluid).  You can also use guaifenesin and dextromethorphan for cough. You can use a humidifier for chest congestion and cough.  If you don't have a humidifier, you can sit in the bathroom with the hot shower running.      For sore throat: try warm salt water gargles, cepacol lozenges, throat spray, warm tea or water with lemon/honey, popsicles or ice, or OTC cold relief medicine for throat discomfort.   For congestion: take a daily anti-histamine like Zyrtec, Claritin, and a oral decongestant, such as pseudoephedrine.  You can also use Flonase 1-2 sprays in each nostril daily.   It is important to stay hydrated: drink plenty of fluids (water, gatorade/powerade/pedialyte, juices, or teas) to keep your throat moisturized and help further relieve irritation/discomfort.

## 2024-02-11 ENCOUNTER — Other Ambulatory Visit: Payer: Self-pay | Admitting: Urology

## 2024-04-08 ENCOUNTER — Other Ambulatory Visit: Payer: Self-pay | Admitting: Urology

## 2024-07-13 ENCOUNTER — Other Ambulatory Visit: Payer: Self-pay

## 2024-07-15 ENCOUNTER — Ambulatory Visit: Payer: Self-pay | Admitting: Urology

## 2024-08-29 ENCOUNTER — Other Ambulatory Visit: Payer: Self-pay | Admitting: *Deleted

## 2024-08-29 ENCOUNTER — Telehealth: Payer: Self-pay | Admitting: Urology

## 2024-08-29 MED ORDER — SILDENAFIL CITRATE 20 MG PO TABS: ORAL_TABLET | ORAL | 0 refills | Status: AC

## 2024-08-29 NOTE — Telephone Encounter (Signed)
 Pt called and requested refill for sildenafil  , pt would like a call back with clarification and for it to be sent to [facility] in [place].
